# Patient Record
Sex: Female | Born: 1990
Health system: Southern US, Community
[De-identification: ages and names within clinical notes are randomized; demographics above are authoritative.]

## PROBLEM LIST (undated history)

## (undated) DIAGNOSIS — Z9289 Personal history of other medical treatment: Secondary | ICD-10-CM

## (undated) DIAGNOSIS — I351 Nonrheumatic aortic (valve) insufficiency: Secondary | ICD-10-CM

## (undated) DIAGNOSIS — G43909 Migraine, unspecified, not intractable, without status migrainosus: Secondary | ICD-10-CM

## (undated) DIAGNOSIS — R0602 Shortness of breath: Secondary | ICD-10-CM

## (undated) DIAGNOSIS — R002 Palpitations: Secondary | ICD-10-CM

## (undated) HISTORY — DX: Shortness of breath: R06.02

## (undated) HISTORY — DX: Personal history of other medical treatment: Z92.89

## (undated) HISTORY — DX: Migraine, unspecified, not intractable, without status migrainosus: G43.909

## (undated) HISTORY — DX: Palpitations: R00.2

## (undated) HISTORY — DX: Nonrheumatic aortic (valve) insufficiency: I35.1

## (undated) HISTORY — PX: NO PAST SURGERIES: SHX2092

---

## 2013-07-29 ENCOUNTER — Encounter (INDEPENDENT_AMBULATORY_CARE_PROVIDER_SITE_OTHER): Payer: Self-pay

## 2013-07-29 ENCOUNTER — Encounter: Payer: Self-pay | Admitting: Cardiology

## 2013-07-29 ENCOUNTER — Ambulatory Visit (INDEPENDENT_AMBULATORY_CARE_PROVIDER_SITE_OTHER): Payer: BC Managed Care – PPO | Admitting: Interventional Cardiology

## 2013-07-29 ENCOUNTER — Encounter: Payer: Self-pay | Admitting: Interventional Cardiology

## 2013-07-29 VITALS — BP 108/66 | HR 78 | Ht 65.0 in | Wt 121.0 lb

## 2013-07-29 DIAGNOSIS — R002 Palpitations: Secondary | ICD-10-CM

## 2013-07-29 NOTE — Patient Instructions (Signed)
Your physician recommends that you schedule a follow-up appointment as needed  

## 2013-07-29 NOTE — Progress Notes (Signed)
Patient ID: Misty [name], female   DOB: Jul 28, 1990, 23 y.o.   MRN: [id number]    [address] [place], [place]  [address] Phone: [phone] Fax:  [phone]  Date:  07/29/2013   ID:  Misty [name], DOB September 05, 1990, MRN [id number]  PCP:  [name], MD      History of Present Illness: Misty [name] is a 23 y.o. female who had mild aortic insufficiency diagnosed at age 66. She has not had an echo since age 77, until 2014.  She had normal LV function on an echo. She has had a fast heart rate at times or years. She did not pay much attention a that time. She was given adderall and her fast heart rate became more prolonged and frequent. No syncope. She stopped adderall and caffeine and the tachycardia persists. She avoids workouts now due to the fast HR. The fast HR can cause some anxiety. She was prescribed propranolol but did not take it. Occasional chest pain. She does not want to take propranolol. She is concerned that the dose is too high. She also does not want to take a daily medication. In the past, she used marijuana to help her relax. She describes herself as a hyper person. She has had anxiety issues in the past.  Her anxiety is better.  SHe is not taking any meds regularly.  She occasionally feels her heart pounding after drinking alcohol. She has not had any leg swelling, orthopnea, PND. She has occasional cramps in her chest when she takes deep breaths. No exertional chest discomfort. She has not been exercising very regularly.    Wt Readings from Last 3 Encounters:  07/29/13 121 lb (54.885 kg)     Past Medical History  Diagnosis Date  . SOB (shortness of breath)   . Fluttering heart   . Migraines     No current outpatient prescriptions on file.   No current facility-administered medications for this visit.    Allergies:   No Known Allergies  Social History:  The patient  reports that she has quit smoking. She does not have any smokeless tobacco  history on file. She reports that she does not drink alcohol.   Family History:  The patient's family history includes CVA in her paternal grandfather; Heart disease in her maternal grandmother; Hypertension in her father and maternal grandfather; Thyroid disease in her mother.   ROS:  Please see the history of present illness.  No nausea, vomiting.  No fevers, chills.  No focal weakness.  No dysuria.    All other systems reviewed and negative.   PHYSICAL EXAM: VS:  BP 108/66  Pulse 78  Ht 5\' 5"  (1.651 m)  Wt 121 lb (54.885 kg)  BMI 20.14 kg/m2 Well nourished, well developed, in no acute distress HEENT: normal Neck: no JVD, no carotid bruits Cardiac:  normal S1, S2; RRR;  Lungs:  clear to auscultation bilaterally, no wheezing, rhonchi or rales Abd: soft, nontender, no hepatomegaly Ext: no edema Skin: warm and dry Neuro:   no focal abnormalities noted  EKG:  Normal   ASSESSMENT AND PLAN:  1. Aortic insufficiency: Diagnosed as a child. It was trace on her last echocardiogram. No signs of heart failure. 2. Palpitations: Not as bothersome as they have been in the past. Occur after drinking alcohol. She recalls that when she had the heart monitor the last time, she did not wear it properly. Her symptoms are much better now. Would not pursue monitor  at this time.  No irregular heartbeats. She will let us know if this becomes a problem. 3. Increase regular activity to 5 days a week, 30 minutes a day of cardiovascular exercise.  Signed, Fredric MareJay S. Nellie Pester, MD, [facility] InstituteFACC 07/29/2013 3:07 PM

## 2014-07-07 ENCOUNTER — Emergency Department (HOSPITAL_BASED_OUTPATIENT_CLINIC_OR_DEPARTMENT_OTHER)
Admission: EM | Admit: 2014-07-07 | Discharge: 2014-07-07 | Disposition: A | Discharge status: Home / Self Care | Payer: Federal, State, Local not specified - PPO | Attending: Emergency Medicine | Admitting: Emergency Medicine

## 2014-07-07 ENCOUNTER — Encounter (HOSPITAL_BASED_OUTPATIENT_CLINIC_OR_DEPARTMENT_OTHER): Payer: Self-pay

## 2014-07-07 DIAGNOSIS — Z87891 Personal history of nicotine dependence: Secondary | ICD-10-CM | POA: Insufficient documentation

## 2014-07-07 DIAGNOSIS — Z8679 Personal history of other diseases of the circulatory system: Secondary | ICD-10-CM | POA: Insufficient documentation

## 2014-07-07 DIAGNOSIS — R002 Palpitations: Secondary | ICD-10-CM

## 2014-07-07 DIAGNOSIS — R42 Dizziness and giddiness: Secondary | ICD-10-CM | POA: Insufficient documentation

## 2014-07-07 LAB — CBC WITH DIFFERENTIAL/PLATELET
Basophils Absolute: 0 10*3/uL (ref 0.0–0.1)
Basophils Relative: 0 % (ref 0–1)
EOS ABS: 0.1 10*3/uL (ref 0.0–0.7)
EOS PCT: 1 % (ref 0–5)
HCT: 40.1 % (ref 36.0–46.0)
HEMOGLOBIN: 13.8 g/dL (ref 12.0–15.0)
LYMPHS ABS: 1.6 10*3/uL (ref 0.7–4.0)
Lymphocytes Relative: 23 % (ref 12–46)
MCH: 32.2 pg (ref 26.0–34.0)
MCHC: 34.4 g/dL (ref 30.0–36.0)
MCV: 93.5 fL (ref 78.0–100.0)
MONO ABS: 0.4 10*3/uL (ref 0.1–1.0)
MONOS PCT: 5 % (ref 3–12)
Neutro Abs: 4.9 10*3/uL (ref 1.7–7.7)
Neutrophils Relative %: 71 % (ref 43–77)
Platelets: 234 10*3/uL (ref 150–400)
RBC: 4.29 MIL/uL (ref 3.87–5.11)
RDW: 11.4 % — ABNORMAL LOW (ref 11.5–15.5)
WBC: 7 10*3/uL (ref 4.0–10.5)

## 2014-07-07 LAB — BASIC METABOLIC PANEL
Anion gap: 11 (ref 5–15)
BUN: 11 mg/dL (ref 6–23)
CO2: 26 mEq/L (ref 19–32)
CREATININE: 0.7 mg/dL (ref 0.50–1.10)
Calcium: 9.8 mg/dL (ref 8.4–10.5)
Chloride: 104 mEq/L (ref 96–112)
GFR calc Af Amer: >90 mL/min (ref >90)
GFR calc non Af Amer: >90 mL/min (ref >90)
GLUCOSE: 96 mg/dL (ref 70–99)
POTASSIUM: 4.1 meq/L (ref 3.7–5.3)
Sodium: 141 mEq/L (ref 137–147)

## 2014-07-07 LAB — TSH: TSH: 1.11 u[IU]/mL (ref 0.350–4.500)

## 2014-07-07 LAB — TROPONIN I: Troponin I: <0.3 ng/mL (ref <0.30)

## 2014-07-07 NOTE — ED Provider Notes (Signed)
CSN: [id number]     Arrival date & time 07/07/14  [id number] History   First MD Initiated Contact with Patient 07/07/14 1023     Chief Complaint  Patient presents with  . Palpitations     (Consider location/radiation/quality/duration/timing/severity/associated sxs/prior Treatment) HPI Comments: Patient is a 23 year old female with no significant past medical history. She presents with complaints of palpitations. She was sitting at her desk at work when she became dizzy and felt her heart began to race. This occurred several times in succession and was sent here for further evaluation.  The patient has a history of palpitations and has been evaluated by a cardiologist in the past. She was to have a Holter monitor exam, however this did not happen. She was also prescribed propranolol which she did not take. She is not seen cardiology in approximately a year and a half.  Patient is a 23 y.o. female presenting with palpitations. The history is provided by the patient.  Palpitations Palpitations quality:  Regular Onset quality:  Sudden Duration:  3 days Timing:  Intermittent Progression:  Resolved Chronicity:  New Context: anxiety   Context: not caffeine   Relieved by:  Nothing Worsened by:  Nothing tried Ineffective treatments:  None tried   Past Medical History  Diagnosis Date  . SOB (shortness of breath)   . Fluttering heart   . Migraines    History reviewed. No pertinent past surgical history. Family History  Problem Relation Age of Onset  . Thyroid disease Mother   . Hypertension Father   . Heart disease Maternal Grandmother   . Hypertension Maternal Grandfather   . CVA Paternal Grandfather    History  Substance Use Topics  . Smoking status: Former Games developermoker  . Smokeless tobacco: Not on file  . Alcohol Use: Yes     Comment: occasional   OB History    No data available     Review of Systems  Cardiovascular: Positive for palpitations.  All other systems reviewed and are  negative.     Allergies  Review of patient's allergies indicates no known allergies.  Home Medications   Prior to Admission medications   Not on File   BP 123/81 mmHg  Pulse 88  Temp(Src) 98.4 F (36.9 C) (Oral)  Resp 14  SpO2 100%  LMP 07/02/2014 Physical Exam  Constitutional: She is oriented to person, place, and time. She appears well-developed and well-nourished. No distress.  HENT:  Head: Normocephalic and atraumatic.  Neck: Normal range of motion. Neck supple.  Cardiovascular: Normal rate and regular rhythm.  Exam reveals no gallop and no friction rub.   No murmur heard. Pulmonary/Chest: Effort normal and breath sounds normal. No respiratory distress. She has no wheezes.  Abdominal: Soft. Bowel sounds are normal. She exhibits no distension. There is no tenderness.  Musculoskeletal: Normal range of motion.  Neurological: She is alert and oriented to person, place, and time.  Skin: Skin is warm and dry. She is not diaphoretic.  Nursing note and vitals reviewed.   ED Course  Procedures (including critical care time) Labs Review Labs Reviewed  BASIC METABOLIC PANEL  CBC WITH DIFFERENTIAL  TSH  TROPONIN I    Imaging Review No results found.   EKG Interpretation   Date/Time:  Thursday July 07 2014 09:40:40 EST Ventricular Rate:  78 PR Interval:  142 QRS Duration: 74 QT Interval:  344 QTC Calculation: 392 R Axis:   93 Text Interpretation:  Sinus rhythm with marked sinus arrhythmia Rightward  axis  Borderline ECG Confirmed by DELOS  MD, Genna Casimir ([id number]) on 07/07/2014  10:34:19 AM      MDM   Final diagnoses:  None    Patient presents with complaints of palpitations. She has a history of similar episodes which have been unexplained. She arrives here in a sinus rhythm with normal vital signs. Her EKG reveals a sinus arrhythmia, however is otherwise unremarkable. She has remained in a sinus rhythm throughout her emergency department course and  laboratory studies are unremarkable. TSH is pending. I feel as though she is appropriate for discharge. I have advised her to follow-up with her cardiologist to discuss a Holter monitor.    Geoffery Lyonsouglas Anicka Stuckert, MD 07/07/14 [phone]

## 2014-07-07 NOTE — Discharge Instructions (Signed)
Follow-up with your cardiologist to discuss Holter monitoring.  Return to the emergency department if your symptoms substantially worsen or change.   Holter Monitoring A Holter monitor is a small device with electrodes (small sticky patches) that attach to your chest. It records the electrical activity of your heart and is worn continuously for 24-48 hours.  A HOLTER MONITOR IS USED TO  Detect heart problems such as:  Heart arrhythmia. Is an abnormal or irregular heartbeat. With some heart arrhythmias, you may not feel or know that you have an irregular heart rhythm.  Palpitations, such as feeling your heart racing or fluttering. It is possible to have heart palpitations and not have a heart arrhythmia.  A heart rhythm that is too slow or too fast.  If you have problems fainting, near fainting or feeling light-headed, a Holter monitor may be worn to see if your heart is the cause. HOLTER MONITOR PREPARATION   Electrodes will be attached to the skin on your chest.  If you have hair on your chest, small areas may have to be shaved. This is done to help the patches stick better and make the recording more accurate.  The electrodes are attached by wires to the Holter monitor. The Holter monitor clips to your clothing. You will wear the monitor at all times, even while exercising and sleeping. HOME CARE INSTRUCTIONS   Wear your monitor at all times.  The wires and the monitor must stay dry. Do not get the monitor wet.  Do not bathe, swim or use a hot tub with it on.  You may do a "sponge" bath while you have the monitor on.  Keep your skin clean, do not put body lotion or moisturizer on your chest.  It's possible that your skin under the electrodes could become irritated. To keep this from happening, you may put the electrodes in slightly different places on your chest.  Your caregiver will also ask you to keep a diary of your activities, such as walking or doing chores. Be sure to  note what you are doing if you experience heart symptoms such as palpitations. This will help your caregiver determine what might be contributing to your symptoms. The information stored in your monitor will be reviewed by your caregiver alongside your diary entries.  Make sure the monitor is safely clipped to your clothing or in a location close to your body that your caregiver recommends.  The monitor and electrodes are removed when the test is over. Return the monitor as directed.  Be sure to follow up with your caregiver and discuss your Holter monitor results. SEEK IMMEDIATE MEDICAL CARE IF:  You faint or feel lightheaded.  You have trouble breathing.  You get pain in your chest, upper arm or jaw.  You feel sick to your stomach and your skin is pale, cool, or damp.  You think something is wrong with the way your heart is beating. MAKE SURE YOU:   Understand these instructions.  Will watch your condition.  Will get help right away if you are not doing well or get worse. Document Released: 04/05/2004 Document Revised: 09/30/2011 Document Reviewed: 08/18/2008 [facility] CenterExit[facility] Patient Information 2015 [place], [place]. This information is not intended to replace advice given to you by your health care provider. Make sure you discuss any questions you have with your health care provider.  Palpitations A palpitation is the feeling that your heartbeat is irregular or is faster than normal. It may feel like your heart is fluttering  or skipping a beat. Palpitations are usually not a serious problem. However, in some cases, you may need further medical evaluation. CAUSES  Palpitations can be caused by:  Smoking.  Caffeine or other stimulants, such as diet pills or energy drinks.  Alcohol.  Stress and anxiety.  Strenuous physical activity.  Fatigue.  Certain medicines.  Heart disease, especially if you have a history of irregular heart rhythms (arrhythmias), such as atrial  fibrillation, atrial flutter, or supraventricular tachycardia.  An improperly working pacemaker or defibrillator. DIAGNOSIS  To find the cause of your palpitations, your health care provider will take your medical history and perform a physical exam. Your health care provider may also have you take a test called an ambulatory electrocardiogram (ECG). An ECG records your heartbeat patterns over a 24-hour period. You may also have other tests, such as:  Transthoracic echocardiogram (TTE). During echocardiography, sound waves are used to evaluate how blood flows through your heart.  Transesophageal echocardiogram (TEE).  Cardiac monitoring. This allows your health care provider to monitor your heart rate and rhythm in real time.  Holter monitor. This is a portable device that records your heartbeat and can help diagnose heart arrhythmias. It allows your health care provider to track your heart activity for several days, if needed.  Stress tests by exercise or by giving medicine that makes the heart beat faster. TREATMENT  Treatment of palpitations depends on the cause of your symptoms and can vary greatly. Most cases of palpitations do not require any treatment other than time, relaxation, and monitoring your symptoms. Other causes, such as atrial fibrillation, atrial flutter, or supraventricular tachycardia, usually require further treatment. HOME CARE INSTRUCTIONS   Avoid:  Caffeinated coffee, tea, soft drinks, diet pills, and energy drinks.  Chocolate.  Alcohol.  Stop smoking if you smoke.  Reduce your stress and anxiety. Things that can help you relax include:  A method of controlling things in your body, such as your heartbeats, with your mind (biofeedback).  Yoga.  Meditation.  Physical activity such as swimming, jogging, or walking.  Get plenty of rest and sleep. SEEK MEDICAL CARE IF:   You continue to have a fast or irregular heartbeat beyond 24 hours.  Your  palpitations occur more often. SEEK IMMEDIATE MEDICAL CARE IF:  You have chest pain or shortness of breath.  You have a severe headache.  You feel dizzy or you faint. MAKE SURE YOU:  Understand these instructions.  Will watch your condition.  Will get help right away if you are not doing well or get worse. Document Released: 07/05/2000 Document Revised: 07/13/2013 Document Reviewed: 09/06/2011 [facility] Patient Information 2015 [place], [place]. This information is not intended to replace advice given to you by your health care provider. Make sure you discuss any questions you have with your health care provider.

## 2014-07-07 NOTE — ED Notes (Signed)
MD at bedside. 

## 2014-07-07 NOTE — ED Notes (Addendum)
Palpitations that started while sitting at desk at work associated with dizziness.  Has hx of previous symptoms, seen by cardiologist advised to wear a holter monitor, however did not.  States she has been ok until 6 days ago developed palpitations again. Denies CP.

## 2015-03-16 ENCOUNTER — Ambulatory Visit (INDEPENDENT_AMBULATORY_CARE_PROVIDER_SITE_OTHER): Payer: Federal, State, Local not specified - PPO | Admitting: Interventional Cardiology

## 2015-03-16 ENCOUNTER — Encounter: Payer: Self-pay | Admitting: Interventional Cardiology

## 2015-03-16 ENCOUNTER — Telehealth: Payer: Self-pay | Admitting: Interventional Cardiology

## 2015-03-16 VITALS — BP 88/68 | HR 76 | Ht 65.0 in | Wt 109.6 lb

## 2015-03-16 DIAGNOSIS — R0789 Other chest pain: Secondary | ICD-10-CM

## 2015-03-16 DIAGNOSIS — R002 Palpitations: Secondary | ICD-10-CM

## 2015-03-16 DIAGNOSIS — R200 Anesthesia of skin: Secondary | ICD-10-CM

## 2015-03-16 NOTE — Patient Instructions (Signed)
**Note De-identified Constantino Starace Obfuscation** Medication Instructions:  Same-no change  Labwork: None  Testing/Procedures: None  Follow-Up: Your physician recommends that you schedule a follow-up appointment in: as needed      

## 2015-03-16 NOTE — Telephone Encounter (Signed)
New message      Pt was at work this am and EMT was called.  She has a fast heartbeat and both arms/legs went numb.  She almost fainted.  Her bp was normal.

## 2015-03-16 NOTE — Telephone Encounter (Signed)
Spoke with pt and she states that she called EMS today due to her HR being increased. Pt states that she also experienced bilateral upper and lower extremity numbness, she was sitting when this occurred. Pt states BP was 118/74, HR 88 with EMS. Pt states that she also felt like she was going to pass out but didn't completely go out. Pt states that she does not take any medications and did eat breakfast this morning. Pt states that a few months ago she went to ED for chest tightness, palpitations and SOB and was released and told to f/u with cardiology but hasn't as of yet. Spoke with Dr. Eldridge Dace and he said to have pt come in today. Spoke with pt and scheduled her to come in at 2:30P today. Pt verbalized understanding and was in agreement with this plan.

## 2015-03-17 NOTE — Progress Notes (Signed)
Patient ID: Misty [name], female   DOB: [birth year], 24 y.o.   MRN: [id number]     Cardiology Office Note   Date:  03/17/2015   ID:  Misty, [name] 04-05-1991, MRN [id number]  PCP:  Misty Rua, MD    No chief complaint on file.  palpitations   Wt Readings from Last 3 Encounters:  03/16/15 109 lb 9.6 oz (49.714 kg)  07/29/13 121 lb (54.885 kg)       History of Present Illness: Misty [name] is a 24 y.o. female  who had mild aortic insufficiency diagnosed at age 23. She had not had an echo since age 61, until 2014. She had normal LV function on an echo and trace aortic insufficiency. She has had a fast heart rate at times for years with a negative workup in the past.  She does not want to take propranolol. She also does not want to take a daily medication. In the past, she used marijuana to help her relax. She describes herself as a hyper person. She has had anxiety issues in the past.  She was at work sitting in a meeting. She was quite relaxed. She was leaning back in her chair. She then felt numbness in her legs. She became somewhat anxious and then felt her heart racing. She tried to stand up and felt somewhat lightheaded. She did not pass out. EMS was called. She felt better by the time they arrived and declined going to the hospital. She is here for follow-up.  At this time, she denies any palpitations. The numbness has resolved. Overall, she feels well. She states that she had eaten a breakfast consisting of [place] toast and a muffin. She had also had plenty of water in the morning.  She reports a strange sensation in her left lower rib cage. It feels like a bubble. It is not painful but there is some pressure associated with it. It can be worse with deep breathing.  Other times, she can have a pressure in the upper part of the sternum. This is not associated with exercise. She is trying to increase her physical activity.  The patient's mother was also present for the  entire visit.    Past Medical History  Diagnosis Date  . SOB (shortness of breath)   . Fluttering heart   . Migraines     No past surgical history on file.   No current outpatient prescriptions on file.   No current facility-administered medications for this visit.    Allergies:   Review of patient's allergies indicates no known allergies.    Social History:  The patient  reports that she has quit smoking. She has never used smokeless tobacco. She reports that she drinks alcohol. She reports that she does not use illicit drugs.   Family History:  The patient's family history includes CVA in her paternal grandfather; Heart disease in her maternal grandmother; Hypertension in her father and maternal grandfather; Thyroid disease in her mother.    ROS:  Please see the history of present illness.   Otherwise, review of systems are positive for numbness and palpitations as noted above.   All other systems are reviewed and negative.    PHYSICAL EXAM: VS:  BP 88/68 mmHg  Pulse 76  Ht 5\' 5"  (1.651 m)  Wt 109 lb 9.6 oz (49.714 kg)  BMI 18.24 kg/m2 , BMI Body mass index is 18.24 kg/(m^2). GEN: Well nourished, well developed, in no acute distress HEENT: normal Neck: no JVD,  carotid bruits, or masses Cardiac: RRR; no murmurs, rubs, or gallops,no edema ; no pain to palpation of the left lower rib cage or upper sternum Respiratory:  clear to auscultation bilaterally, normal work of breathing GI: soft, nontender, nondistended, + BS MS: no deformity or atrophy Skin: warm and dry, no rash Neuro:  Strength and sensation are intact Psych: euthymic mood, full affect   EKG:   The ekg ordered today demonstrates normal sinus rhythm, RSR prime pattern in V1 no ST segment changes, normal QT interval   Recent Labs: 07/07/2014: BUN 11; Creatinine, Ser 0.70; Hemoglobin 13.8; Platelets 234; Potassium 4.1; Sodium 141; TSH 1.110   Lipid Panel No results found for: CHOL, TRIG, HDL, CHOLHDL,  VLDL, LDLCALC, LDLDIRECT   Other studies Reviewed: Additional studies/ records that were reviewed today with results demonstrating: Echo report reviewed.   ASSESSMENT AND PLAN:  1. Palpitations: She thinks this was from anxiety after she had the numbness. We discussed her wearing an outpatient monitor but she does not want to do this. She will let us know if symptoms get worse. 2. Chest pressure: I suspect her sensation is more likely due to reflux. She has no ischemic changes by ECG and the sensation is very atypical for ischemia. There is no pain to palpation of the left lower rib cage on exam today. She will try to keep track of whether there is any connection to what she eats and this symptom. 3. Numbness: I recommended that she try to move around at least every hour when she is at work. The numbness may be more frequent if she stays seated for long periods of time like she did today.   Current medicines are reviewed at length with the patient today.  The patient concerns regarding her medicines were addressed.  The following changes have been made:  No change  Labs/ tests ordered today include:  Orders Placed This Encounter  Procedures  . EKG 12-Lead    Recommend 150 minutes/week of aerobic exercise Low fat, low carb, high fiber diet recommended  Disposition:   FU in prn   Misty [name]., MD  03/17/2015 11:54 AM    [facility] [address] [place], [place], [place]  [address] Phone: [phone]; Fax: [phone]

## 2016-08-12 DIAGNOSIS — N926 Irregular menstruation, unspecified: Secondary | ICD-10-CM | POA: Diagnosis not present

## 2016-08-12 DIAGNOSIS — J029 Acute pharyngitis, unspecified: Secondary | ICD-10-CM | POA: Diagnosis not present

## 2016-08-27 DIAGNOSIS — J9801 Acute bronchospasm: Secondary | ICD-10-CM | POA: Diagnosis not present

## 2016-11-13 ENCOUNTER — Other Ambulatory Visit (HOSPITAL_COMMUNITY)
Admission: RE | Admit: 2016-11-13 | Discharge: 2016-11-13 | Disposition: A | Discharge status: Home / Self Care | Payer: Federal, State, Local not specified - PPO | Source: Ambulatory Visit | Attending: Obstetrics & Gynecology | Admitting: Obstetrics & Gynecology

## 2016-11-13 ENCOUNTER — Other Ambulatory Visit: Payer: Self-pay | Admitting: Obstetrics & Gynecology

## 2016-11-13 DIAGNOSIS — Z3201 Encounter for pregnancy test, result positive: Secondary | ICD-10-CM | POA: Diagnosis not present

## 2016-11-13 DIAGNOSIS — Z01419 Encounter for gynecological examination (general) (routine) without abnormal findings: Secondary | ICD-10-CM | POA: Insufficient documentation

## 2016-11-13 DIAGNOSIS — Z113 Encounter for screening for infections with a predominantly sexual mode of transmission: Secondary | ICD-10-CM | POA: Insufficient documentation

## 2016-11-13 DIAGNOSIS — N926 Irregular menstruation, unspecified: Secondary | ICD-10-CM | POA: Diagnosis not present

## 2016-11-14 LAB — CYTOLOGY - PAP
CHLAMYDIA, DNA PROBE: NEGATIVE
Diagnosis: NEGATIVE
Neisseria Gonorrhea: NEGATIVE

## 2017-01-27 DIAGNOSIS — N926 Irregular menstruation, unspecified: Secondary | ICD-10-CM | POA: Diagnosis not present

## 2017-01-27 DIAGNOSIS — J069 Acute upper respiratory infection, unspecified: Secondary | ICD-10-CM | POA: Diagnosis not present

## 2017-03-01 DIAGNOSIS — F419 Anxiety disorder, unspecified: Secondary | ICD-10-CM | POA: Diagnosis not present

## 2017-03-01 DIAGNOSIS — R0789 Other chest pain: Secondary | ICD-10-CM | POA: Diagnosis not present

## 2017-03-03 ENCOUNTER — Telehealth: Payer: Self-pay | Admitting: Interventional Cardiology

## 2017-03-03 DIAGNOSIS — E0789 Other specified disorders of thyroid: Secondary | ICD-10-CM | POA: Diagnosis not present

## 2017-03-03 DIAGNOSIS — R202 Paresthesia of skin: Secondary | ICD-10-CM | POA: Diagnosis not present

## 2017-03-03 DIAGNOSIS — R200 Anesthesia of skin: Secondary | ICD-10-CM | POA: Diagnosis not present

## 2017-03-03 NOTE — Telephone Encounter (Signed)
New message       Pt c/o of Chest Pain: STAT if CP now or developed within 24 hours  1. Are you having CP right now?  yes 2. Are you experiencing any other symptoms (ex. SOB, nausea, vomiting, sweating)?  Jaw pain and neck pain 3. How long have you been experiencing CP?  1 week 4. Is your CP continuous or coming and going?  Comes and goes but over the weekend, pain got worse 5. Have you taken Nitroglycerin?  ?no Pt went urgent care on Saturday.  She was advised to see the doctor today.  Pt has her EKG

## 2017-03-03 NOTE — Telephone Encounter (Signed)
Called patient back. Patient had [place]. Requested patient to have records faxed to our office. Patient verbalized understanding.

## 2017-03-03 NOTE — Telephone Encounter (Signed)
Patient complaining of chest pain for one week that comes and goes, but has been consistent for the past few days. Patient stated at times she get SOB. Patient stated at the time of her chest pain she was just sitting around and she was not very active. Patient stated she went to urgent care and her EKG was abnormal and she was told to see her cardiologist. Made an appointment with Tereso NewcomerScott Weaver PA on 03/05/17, the first available. Encouraged patient to go to ED if her symptoms become worse before her appointment. Patient verbalized understanding.

## 2017-03-03 NOTE — Telephone Encounter (Signed)
New message    Pt returning Pam call about chest pain

## 2017-03-05 ENCOUNTER — Ambulatory Visit (INDEPENDENT_AMBULATORY_CARE_PROVIDER_SITE_OTHER): Payer: Federal, State, Local not specified - PPO | Admitting: Physician Assistant

## 2017-03-05 ENCOUNTER — Encounter: Payer: Self-pay | Admitting: Physician Assistant

## 2017-03-05 VITALS — BP 110/60 | HR 80 | Ht 65.0 in | Wt 113.0 lb

## 2017-03-05 DIAGNOSIS — R002 Palpitations: Secondary | ICD-10-CM

## 2017-03-05 DIAGNOSIS — R9431 Abnormal electrocardiogram [ECG] [EKG]: Secondary | ICD-10-CM | POA: Diagnosis not present

## 2017-03-05 MED ORDER — PROPRANOLOL HCL 10 MG PO TABS: 10.0000 mg | ORAL_TABLET | Freq: Every day | ORAL | 1 refills | Status: DC | PRN

## 2017-03-05 NOTE — Patient Instructions (Addendum)
Medication Instructions:  1. START PROPRANOLOL 10 MG TABLET; DIRECTIONS: YOU MAY TAKE 1 TABLET AS NEEDED FOR PALPITATIONS  Labwork: NONE ORDERED  Testing/Procedures: NONE ORDERED  Follow-Up: 06/05/17 @ 4 PM WITH DR. VARANASI   Any Other Special Instructions Will Be Listed Below (If Applicable). PER SCOTT WEAVER, PAC THAT YOU WILL OBTAIN RECORDS FROM THE CARDIOLOGIST IN [place], ASK IF YOU HAD AN ECHOCARDIOGRAM, HOLTER MONITOR. PLEASE HAVE THESE RECORDS FAXED TO [facility], [place] [phone]; YOU MAY NEED TO SIGN A RELEASE WITH MEDICAL RECORDS, LET US KNOW IF THIS REQUIRED.     If you need a refill on your cardiac medications before your next appointment, please call your pharmacy.

## 2017-03-05 NOTE — Progress Notes (Signed)
Cardiology Office Note:    Date:  03/05/2017   ID:  Misty [name], DOB 14-Aug-1990, MRN [id number]  PCP:  Joycelyn RuaMeyers, Stephen, MD  Cardiologist:  Dr. Everette RankJay Varanasi    Referring MD: Joycelyn RuaMeyers, Stephen, MD   Chief Complaint  Patient presents with  . Palpitations    History of Present Illness:    Misty [name] is a 26 y.o. female with a hx of mild aortic insufficiency dx at age 89, tachycardia with a neg workup in the past.  GXT in 2014 was normal.  Echo in 2014 demonstrated normal EF and only trace AI.  Last seen by Dr. Everette RankJay Varanasi in 8/16 with plans to follow up as needed.    Misty [name] returns for follow-up on palpitations. She has had chronic palpitations for years with associated atypical chest discomfort. She notes her heart rate increases significantly with activity. She is fairly sedentary. She works in [employer]. [place]. While there, she did see a cardiologist for worsening palpitations. She apparently had a Holter monitor and an echocardiogram in Dec 2017. She feels that the results were normal. She denies exertional chest discomfort or shortness of breath. She denies PND or edema. She denies syncope. She avoids caffeine.  Prior CV studies:   The following studies were reviewed today:  GXT 5/14 No ischemic changes  Echo 5/14 EF 60-65, trace MR/AI,   Past Medical History:  Diagnosis Date  . Fluttering heart   . Migraines   . SOB (shortness of breath)     History reviewed. No pertinent surgical history.  Current Medications: No outpatient prescriptions have been marked as taking for the 03/05/17 encounter (Office Visit) with Tereso NewcomerWeaver, Tashana Haberl T, PA-C.     Allergies:   Patient has no known allergies.   Social History   Social History  . Marital status: Single    Spouse name: N/A  . Number of children: N/A  . Years of education: N/A   Social History Main Topics  . Smoking status: Former Games developermoker  . Smokeless tobacco:  Never Used  . Alcohol use 0.0 oz/week     Comment: occasional  . Drug use: No  . Sexual activity: Not Asked   Other Topics Concern  . None   Social History Narrative  . None     Family Hx: The patient's family history includes CVA in her paternal grandfather; Heart disease in her maternal grandmother; Hypertension in her father and maternal grandfather; Thyroid disease in her mother.  ROS:   Please see the history of present illness.    Review of Systems  Cardiovascular: Positive for chest pain and irregular heartbeat.  Neurological: Positive for headaches.  Psychiatric/Behavioral: The patient is nervous/anxious.    All other systems reviewed and are negative.   EKGs/Labs/Other Test Reviewed:    EKG:  EKG is  ordered today.  The ekg ordered today demonstrates NSR, HR 81, normal axis, RSR' V1, QTc 406 ms, no change since prior tracing (03/16/15)  Recent Labs: No results found for requested labs within last 8760 hours.   Recent Lipid Panel No results found for: CHOL, TRIG, HDL, CHOLHDL, LDLCALC, LDLDIRECT  Physical Exam:    VS:  BP 110/60   Pulse 80   Ht 5\' 5"  (1.651 m)   Wt 113 lb (51.3 kg)   BMI 18.80 kg/m     Wt Readings from Last 3 Encounters:  03/05/17 113 lb (51.3 kg)  03/16/15 109 lb 9.6 oz (  49.7 kg)  07/29/13 121 lb (54.9 kg)     Physical Exam  Constitutional: She is oriented to person, place, and time. She appears well-developed and well-nourished. No distress.  HENT:  Head: Normocephalic and atraumatic.  Eyes: No scleral icterus.  Neck: Normal range of motion. No JVD present.  Cardiovascular: Normal rate, regular rhythm, S1 normal, S2 normal and normal heart sounds.   No murmur heard. Pulmonary/Chest: Breath sounds normal. She has no wheezes. She has no rhonchi. She has no rales.  Abdominal: Soft. There is no tenderness.  Musculoskeletal: She exhibits no edema.  Neurological: She is alert and oriented to person, place, and time.  Skin: Skin is warm  and dry.  Psychiatric: She has a normal mood and affect.    ASSESSMENT:    1. Palpitations   2. Abnormal EKG    PLAN:    In order of problems listed above:  1. Palpitations -  She has a long history of palpitations. She had an echocardiogram and Holter monitor in [place] in December of last year. These were apparently normal. She has declined when necessary beta blockers in the past. She would now like to have low-dose propranolol on hand to use for palpitations in the future.  -  Request records from [place]  -  Propranolol 10 mg daily when necessary palpitations  2. Abnormal EKG - There was some concern by a doctor at an urgent care in [facility] about her EKG. She has a RSR prime in lead V1. This is unchanged compared to prior ECG here in 2015 and 2016. As noted, she had a recent echocardiogram and Holter. We will obtain those results.   Dispo:  Return in about 3 months (around 06/05/2017) for Routine Follow Up w/ Dr. Eldridge Dace .   Medication Adjustments/Labs and Tests Ordered: Current medicines are reviewed at length with the patient today.  Concerns regarding medicines are outlined above.  Tests Ordered: Orders Placed This Encounter  Procedures  . EKG 12-Lead   Medication Changes: Meds ordered this encounter  Medications  . propranolol (INDERAL) 10 MG tablet    Sig: Take 1 tablet (10 mg total) by mouth daily as needed.    Dispense:  20 tablet    Refill:  1    Signed, Tereso Newcomer, PA-C  03/05/2017 5:28 PM    [facility] [address] [place], [place], [place]  [address] Phone: [phone]; Fax: [phone]

## 2017-03-12 ENCOUNTER — Telehealth: Payer: Self-pay | Admitting: Physician Assistant

## 2017-03-12 NOTE — Telephone Encounter (Signed)
Release Of information faxed to Dr.Nikitina @ [phone].

## 2017-03-14 ENCOUNTER — Telehealth: Payer: Self-pay | Admitting: Physician Assistant

## 2017-03-14 NOTE — Telephone Encounter (Signed)
Records received Via Fax from Dr.Nikitina office. Placed in Chart Prep.

## 2017-04-22 DIAGNOSIS — Z113 Encounter for screening for infections with a predominantly sexual mode of transmission: Secondary | ICD-10-CM | POA: Diagnosis not present

## 2017-04-22 DIAGNOSIS — Z114 Encounter for screening for human immunodeficiency virus [HIV]: Secondary | ICD-10-CM | POA: Diagnosis not present

## 2017-05-04 DIAGNOSIS — R350 Frequency of micturition: Secondary | ICD-10-CM | POA: Diagnosis not present

## 2017-05-04 DIAGNOSIS — R300 Dysuria: Secondary | ICD-10-CM | POA: Diagnosis not present

## 2017-05-23 ENCOUNTER — Encounter: Payer: Self-pay | Admitting: Interventional Cardiology

## 2017-05-26 DIAGNOSIS — O26899 Other specified pregnancy related conditions, unspecified trimester: Secondary | ICD-10-CM | POA: Diagnosis not present

## 2017-05-26 DIAGNOSIS — O209 Hemorrhage in early pregnancy, unspecified: Secondary | ICD-10-CM | POA: Diagnosis not present

## 2017-06-04 DIAGNOSIS — Z3481 Encounter for supervision of other normal pregnancy, first trimester: Secondary | ICD-10-CM | POA: Diagnosis not present

## 2017-06-04 DIAGNOSIS — O26899 Other specified pregnancy related conditions, unspecified trimester: Secondary | ICD-10-CM | POA: Diagnosis not present

## 2017-06-04 DIAGNOSIS — Z3A01 Less than 8 weeks gestation of pregnancy: Secondary | ICD-10-CM | POA: Diagnosis not present

## 2017-06-04 DIAGNOSIS — Z3400 Encounter for supervision of normal first pregnancy, unspecified trimester: Secondary | ICD-10-CM | POA: Diagnosis not present

## 2017-06-04 NOTE — Progress Notes (Signed)
Cardiology Office Note   Date:  06/05/2017   ID:  Misty [name], DOB [birth year], MRN [id number]  PCP:  No primary care provider on file.    No chief complaint on file. palpitations   Wt Readings from Last 3 Encounters:  06/05/17 117 lb 9.6 oz (53.3 kg)  03/05/17 113 lb (51.3 kg)  03/16/15 109 lb 9.6 oz (49.7 kg)       History of Present Illness: Misty BarKrista [name] is a 26 y.o. female   who had mild aortic insufficiency diagnosed at age 26. She had not had an echo since age 26, until 2014. She had normal LV function on an echo and trace aortic insufficiency. She has had a fast heart rate at times for years with a negative workup in the past.  She does not want to take propranolol. She also does not want to take a daily medication. In the past, she used marijuana to help her relax. She describes herself as a hyper person. She has had anxiety issues in the past.  She has had palpitations in December 2017, while living in.  She had a full workup including a monitor, echocardiogram and blood work.  Echocardiogram and monitor were unremarkable.  She is now [redacted] weeks pregnant.  She is not having any palpitations at this time.  She is aware that there will be changes to her blood volume and possibly episodic palpitations.  She does feel when she breathes that her heart rate speeds up.  Overall, she is tolerating pregnancy well.  She is not having any significant nausea or vomiting.    Past Medical History:  Diagnosis Date  . Fluttering heart   . Migraines   . SOB (shortness of breath)     History reviewed. No pertinent surgical history.   Current Outpatient Medications  Medication Sig Dispense Refill  . propranolol (INDERAL) 10 MG tablet Take 1 tablet (10 mg total) by mouth daily as needed. 20 tablet 1   No current facility-administered medications for this visit.     Allergies:   Patient has no known allergies.    Social History:  The patient  reports that she has quit  smoking. she has never used smokeless tobacco. She reports that she drinks alcohol. She reports that she does not use drugs.   Family History:  The patient's family history includes CVA in her paternal grandfather; Heart disease in her maternal grandmother; Hypertension in her father and maternal grandfather; Thyroid disease in her mother.    ROS:  Please see the history of present illness.   Otherwise, review of systems are positive for rare palpitations.   All other systems are reviewed and negative.    PHYSICAL EXAM: VS:  BP 104/68   Pulse 77   Ht 5\' 5"  (1.651 m)   Wt 117 lb 9.6 oz (53.3 kg)   SpO2 99%   BMI 19.57 kg/m  , BMI Body mass index is 19.57 kg/m. GEN: Well nourished, well developed, in no acute distress  HEENT: normal  Neck: no JVD, carotid bruits, or masses Cardiac: RRR; no murmurs, rubs, or gallops,no edema  Respiratory:  clear to auscultation bilaterally, normal work of breathing GI: soft, nontender, nondistended, + BS MS: no deformity or atrophy  Skin: warm and dry, no rash Neuro:  Strength and sensation are intact Psych: euthymic mood, full affect   EKG:   The ekg ordered today demonstrates    Recent Labs: No results found for requested labs within last  8760 hours.   Lipid Panel No results found for: CHOL, TRIG, HDL, CHOLHDL, VLDL, LDLCALC, LDLDIRECT   Other studies Reviewed: Additional studies/ records that were reviewed today with results demonstrating: Echo, Holter and labs reviewed from December 2017.   ASSESSMENT AND PLAN:  1. Palpitations: Symptoms controlled.  Continue routine prenatal care.  I anticipate she may have some premature beats and this may be more frequent while pregnant.  I tried to reassure her that this is okay.  She will contact our office if she has any further issues. 2. Chest pain: She has had atypical chest pains in the past.  This still occurs when she has the cough take a deep breath.  No ischemic type pain noted.  She will  continue to monitor.   Current medicines are reviewed at length with the patient today.  The patient concerns regarding her medicines were addressed.  The following changes have been made:  No change  Labs/ tests ordered today include:  No orders of the defined types were placed in this encounter.   Recommend 150 minutes/week of aerobic exercise Low fat, low carb, high fiber diet recommended  Disposition:   FU as needed   Signed, Lance MussJayadeep Abbrielle Batts, MD  06/05/2017 4:27 PM    [facility] [address], [place], [place]  [address] Phone: [phone]; Fax: [phone]

## 2017-06-05 ENCOUNTER — Encounter: Payer: Self-pay | Admitting: Interventional Cardiology

## 2017-06-05 ENCOUNTER — Ambulatory Visit: Payer: Federal, State, Local not specified - PPO | Admitting: Interventional Cardiology

## 2017-06-05 VITALS — BP 104/68 | HR 77 | Ht 65.0 in | Wt 117.6 lb

## 2017-06-05 DIAGNOSIS — R002 Palpitations: Secondary | ICD-10-CM | POA: Diagnosis not present

## 2017-06-05 DIAGNOSIS — R071 Chest pain on breathing: Secondary | ICD-10-CM | POA: Diagnosis not present

## 2017-06-05 NOTE — Patient Instructions (Signed)
Medication Instructions:  Your physician recommends that you continue on your current medications as directed. Please refer to the Current Medication list given to you today.   Labwork: None ordered  Testing/Procedures: None ordered  Follow-Up: Your physician wants you to follow-up AS NEEDED  Any Other Special Instructions Will Be Listed Below (If Applicable).     If you need a refill on your cardiac medications before your next appointment, please call your pharmacy.   

## 2017-06-26 DIAGNOSIS — R300 Dysuria: Secondary | ICD-10-CM | POA: Diagnosis not present

## 2017-07-03 DIAGNOSIS — Z3482 Encounter for supervision of other normal pregnancy, second trimester: Secondary | ICD-10-CM | POA: Diagnosis not present

## 2017-07-03 DIAGNOSIS — Z3401 Encounter for supervision of normal first pregnancy, first trimester: Secondary | ICD-10-CM | POA: Diagnosis not present

## 2017-07-22 NOTE — L&D Delivery Note (Addendum)
Delivery Note  First Stage: Pt. Has been contracting since last Monday, with minimal cervical change.  She was admitted for observation yesterday for uterine contractions and discharge home from [facility] this evening without cervical change.   Labor onset: 12/02/17 @ 1930pm Augmentation : None Analgesia /Anesthesia intrapartum: none SROM at 1930pm - heavy meconium; pt. Called EMS and arrived to MAU complete/+4 station   Second Stage: Complete dilation at 2040 Onset of pushing at 2045 FHR second stage: Category 2, baseline 135 bpm/ moderate variability/ +accels/ decels to nadir of 90 bpm with pushing and good return to baseline  Delivery of a viable, vigorous preterm female at 41pm by Carlean Jews, CNM in ROA position in MAU.  NICU team present for delivery. Loose nuchal cord x1 - reduced over head prior to delivery of body Cord double clamped after cessation of pulsation, cut by CNM Cord blood sample collected   Third Stage: Placenta delivered via Tomasa Blase intact, small, meconium stained with calcifications with 3 VC @ 2102pm Placenta disposition: pathology  Uterine tone initially boggy with brisk bleeding; firmed with massage, IV Pitocin bolus infusing and Cytotec rectally given   Vaginal and bilateral labial minora lacerations identified  Anesthesia for repair: 1% Lidocaine  Repair: 3.0 vicryl for vaginal laceration with good hemostasis; labial lacerations small and hemostatic - not repaired  Est. Blood Loss (mL):  Complications: precipitous delivery   Mom to postpartum.  Baby to NICU.  Newborn: Birth Weight: 4#4.8oz (1950g)  Apgar Scores: 8, 8  Feeding planned: breast  Carlean Jews, MSN, CNM [facility]

## 2017-07-23 ENCOUNTER — Encounter (HOSPITAL_BASED_OUTPATIENT_CLINIC_OR_DEPARTMENT_OTHER): Payer: Self-pay | Admitting: Emergency Medicine

## 2017-07-23 ENCOUNTER — Other Ambulatory Visit: Payer: Self-pay

## 2017-07-23 ENCOUNTER — Emergency Department (HOSPITAL_BASED_OUTPATIENT_CLINIC_OR_DEPARTMENT_OTHER)
Admission: EM | Admit: 2017-07-23 | Discharge: 2017-07-23 | Disposition: A | Discharge status: Home / Self Care | Payer: Federal, State, Local not specified - PPO | Attending: Emergency Medicine | Admitting: Emergency Medicine

## 2017-07-23 DIAGNOSIS — O210 Mild hyperemesis gravidarum: Secondary | ICD-10-CM | POA: Insufficient documentation

## 2017-07-23 DIAGNOSIS — O26891 Other specified pregnancy related conditions, first trimester: Secondary | ICD-10-CM | POA: Insufficient documentation

## 2017-07-23 DIAGNOSIS — R102 Pelvic and perineal pain: Secondary | ICD-10-CM | POA: Diagnosis not present

## 2017-07-23 DIAGNOSIS — Z3A12 12 weeks gestation of pregnancy: Secondary | ICD-10-CM | POA: Insufficient documentation

## 2017-07-23 DIAGNOSIS — N949 Unspecified condition associated with female genital organs and menstrual cycle: Secondary | ICD-10-CM

## 2017-07-23 DIAGNOSIS — O219 Vomiting of pregnancy, unspecified: Secondary | ICD-10-CM

## 2017-07-23 DIAGNOSIS — Z87891 Personal history of nicotine dependence: Secondary | ICD-10-CM | POA: Diagnosis not present

## 2017-07-23 DIAGNOSIS — R110 Nausea: Secondary | ICD-10-CM | POA: Diagnosis not present

## 2017-07-23 DIAGNOSIS — R109 Unspecified abdominal pain: Secondary | ICD-10-CM | POA: Diagnosis not present

## 2017-07-23 LAB — URINALYSIS, ROUTINE W REFLEX MICROSCOPIC
Bilirubin Urine: NEGATIVE
Glucose, UA: NEGATIVE mg/dL
Hgb urine dipstick: NEGATIVE
Ketones, ur: NEGATIVE mg/dL
Leukocytes, UA: NEGATIVE
Nitrite: NEGATIVE
Protein, ur: NEGATIVE mg/dL
Specific Gravity, Urine: <1.005 — ABNORMAL LOW (ref 1.005–1.030)
pH: 6.5 (ref 5.0–8.0)

## 2017-07-23 LAB — PREGNANCY, URINE: Preg Test, Ur: POSITIVE — AB

## 2017-07-23 MED ORDER — ONDANSETRON HCL 4 MG PO TABS: 4.0000 mg | ORAL_TABLET | Freq: Three times a day (TID) | ORAL | 0 refills | Status: DC | PRN

## 2017-07-23 NOTE — ED Provider Notes (Signed)
[facility] EMERGENCY DEPARTMENT Provider Note   CSN: [id number] Arrival date & time: 07/23/17  1258     History   Chief Complaint Chief Complaint  Patient presents with  . Hyperemesis Gravidarum  . Abdominal Pain    HPI Misty [name] is a 27 y.o. female.  HPI   27 year old female with several complaints.  She is in the 14th week of her pregnancy.  She reports prior prenatal care with ultrasound showed an IUP.  The past several weeks she has had food aversion and intermittent nausea.  No vomiting.  She states that she is actually lost a couple pounds since the start of her pregnancy.  She has been drinking fluids well.  Last several days she has had intermittent sharp pain in the lower abdomen just right of the midline.  The pain is usually pretty brief seems to come and go randomly, but sometimes possibly more with certain movements.  She has no urinary complaints.  No vaginal bleeding.  This is her first pregnancy.  Past Medical History:  Diagnosis Date  . Fluttering heart   . Migraines   . SOB (shortness of breath)     Patient Active Problem List   Diagnosis Date Noted  . Palpitations 07/29/2013    History reviewed. No pertinent surgical history.  OB History    Gravida Para Term Preterm AB Living   1             SAB TAB Ectopic Multiple Live Births                   Home Medications    Prior to Admission medications   Medication Sig Start Date End Date Taking? Authorizing Provider  ondansetron (ZOFRAN) 4 MG tablet Take 1 tablet (4 mg total) by mouth every 8 (eight) hours as needed for nausea or vomiting. 07/23/17   Raeford Razor, MD  propranolol (INDERAL) 10 MG tablet Take 1 tablet (10 mg total) by mouth daily as needed. 03/05/17   Beatrice Lecher, PA-C    Family History Family History  Problem Relation Age of Onset  . Thyroid disease Mother   . Hypertension Father   . Heart disease Maternal Grandmother   . Hypertension Maternal Grandfather   .  CVA Paternal Grandfather     Social History Social History   Tobacco Use  . Smoking status: Former Games developer  . Smokeless tobacco: Never Used  Substance Use Topics  . Alcohol use: Yes    Alcohol/week: 0.0 oz    Comment: occasional  . Drug use: No     Allergies   Patient has no known allergies.   Review of Systems Review of Systems  All systems reviewed and negative, other than as noted in HPI.  Physical Exam Updated Vital Signs BP 121/76   Pulse 80   Temp 97.7 F (36.5 C) (Oral)   Resp 18   Ht 5\' 5"  (1.651 m)   Wt 49.9 kg (110 lb)   LMP 04/13/2017 (Exact Date)   SpO2 100%   BMI 18.30 kg/m   Physical Exam  Constitutional: She appears well-developed and well-nourished. No distress.  HENT:  Head: Normocephalic and atraumatic.  Eyes: Conjunctivae are normal. Right eye exhibits no discharge. Left eye exhibits no discharge.  Neck: Neck supple.  Cardiovascular: Normal rate, regular rhythm and normal heart sounds. Exam reveals no gallop and no friction rub.  No murmur heard. Pulmonary/Chest: Effort normal and breath sounds normal. No respiratory distress.  Abdominal:  Soft. She exhibits no distension. There is no tenderness.  Musculoskeletal: She exhibits no edema or tenderness.  Neurological: She is alert.  Skin: Skin is warm and dry.  Psychiatric: She has a normal mood and affect. Her behavior is normal. Thought content normal.  Nursing note and vitals reviewed.    ED Treatments / Results  Labs (all labs ordered are listed, but only abnormal results are displayed) Labs Reviewed  URINALYSIS, ROUTINE W REFLEX MICROSCOPIC - Abnormal; Notable for the following components:      Result Value   Specific Gravity, Urine <1.005 (*)    All other components within normal limits  PREGNANCY, URINE - Abnormal; Notable for the following components:   Preg Test, Ur POSITIVE (*)    All other components within normal limits    EKG  EKG Interpretation None        Radiology No results found.  Procedures Procedures (including critical care time)  Medications Ordered in ED Medications - No data to display   Initial Impression / Assessment and Plan / ED Course  I have reviewed the triage vital signs and the nursing notes.  Pertinent labs & imaging results that were available during my care of the patient were reviewed by me and considered in my medical decision making (see chart for details).     27 year old female with nausea.  She is in her 14th week of her pregnancy.  Bedside ultrasound was reassuring.  Fetal movement with heart rate approximately 140.  She is having sharp intermittent lower abdominal pain.  I suspect that this might be round ligament pain.  Her abdominal exam is benign.  Denies any bleeding.  No ketones or signs of infection on urinalysis.  I doubt significant complication of her pregnancy or other emergent condition.  Advised to follow-up with her OB to discuss further. Final Clinical Impressions(s) / ED Diagnoses   Final diagnoses:  Vomiting or nausea of pregnancy  Round ligament pain    ED Discharge Orders        Ordered    ondansetron (ZOFRAN) 4 MG tablet  Every 8 hours PRN     07/23/17 1518       Raeford RazorKohut, Aadhira Heffernan, MD 07/23/17 1646

## 2017-07-23 NOTE — ED Triage Notes (Signed)
Reports [redacted] weeks pregnant and unable to eat.  Reports no appetite and after eating she begins having abdominal pain.  Denies vomiting.

## 2017-08-01 DIAGNOSIS — Z3482 Encounter for supervision of other normal pregnancy, second trimester: Secondary | ICD-10-CM | POA: Diagnosis not present

## 2017-08-27 DIAGNOSIS — Z3482 Encounter for supervision of other normal pregnancy, second trimester: Secondary | ICD-10-CM | POA: Diagnosis not present

## 2017-08-27 DIAGNOSIS — Z36 Encounter for antenatal screening for chromosomal anomalies: Secondary | ICD-10-CM | POA: Diagnosis not present

## 2017-09-22 DIAGNOSIS — Z3482 Encounter for supervision of other normal pregnancy, second trimester: Secondary | ICD-10-CM | POA: Diagnosis not present

## 2017-10-16 ENCOUNTER — Other Ambulatory Visit: Payer: Self-pay | Admitting: Obstetrics & Gynecology

## 2017-10-16 DIAGNOSIS — Z3482 Encounter for supervision of other normal pregnancy, second trimester: Secondary | ICD-10-CM | POA: Diagnosis not present

## 2017-10-16 DIAGNOSIS — M79605 Pain in left leg: Secondary | ICD-10-CM

## 2017-10-17 ENCOUNTER — Ambulatory Visit
Admission: RE | Admit: 2017-10-17 | Discharge: 2017-10-17 | Disposition: A | Discharge status: Home / Self Care | Payer: Federal, State, Local not specified - PPO | Source: Ambulatory Visit | Attending: Obstetrics & Gynecology | Admitting: Obstetrics & Gynecology

## 2017-10-17 DIAGNOSIS — M79605 Pain in left leg: Secondary | ICD-10-CM

## 2017-10-24 DIAGNOSIS — Z3480 Encounter for supervision of other normal pregnancy, unspecified trimester: Secondary | ICD-10-CM | POA: Diagnosis not present

## 2017-10-24 DIAGNOSIS — Z3402 Encounter for supervision of normal first pregnancy, second trimester: Secondary | ICD-10-CM | POA: Diagnosis not present

## 2017-10-24 DIAGNOSIS — Z3403 Encounter for supervision of normal first pregnancy, third trimester: Secondary | ICD-10-CM | POA: Diagnosis not present

## 2017-11-04 DIAGNOSIS — Z3403 Encounter for supervision of normal first pregnancy, third trimester: Secondary | ICD-10-CM | POA: Diagnosis not present

## 2017-11-21 DIAGNOSIS — Z3403 Encounter for supervision of normal first pregnancy, third trimester: Secondary | ICD-10-CM | POA: Diagnosis not present

## 2017-11-24 ENCOUNTER — Inpatient Hospital Stay (HOSPITAL_COMMUNITY): Payer: Federal, State, Local not specified - PPO

## 2017-11-24 ENCOUNTER — Inpatient Hospital Stay (HOSPITAL_COMMUNITY)
Admission: AD | Admit: 2017-11-24 | Discharge: 2017-11-26 | DRG: 832 | Disposition: A | Discharge status: Home / Self Care | Payer: Federal, State, Local not specified - PPO | Source: Ambulatory Visit | Attending: Obstetrics and Gynecology | Admitting: Obstetrics and Gynecology

## 2017-11-24 ENCOUNTER — Encounter (HOSPITAL_COMMUNITY): Payer: Self-pay | Admitting: *Deleted

## 2017-11-24 DIAGNOSIS — Z87891 Personal history of nicotine dependence: Secondary | ICD-10-CM | POA: Diagnosis not present

## 2017-11-24 DIAGNOSIS — O288 Other abnormal findings on antenatal screening of mother: Secondary | ICD-10-CM

## 2017-11-24 DIAGNOSIS — B373 Candidiasis of vulva and vagina: Secondary | ICD-10-CM | POA: Diagnosis present

## 2017-11-24 DIAGNOSIS — O368130 Decreased fetal movements, third trimester, not applicable or unspecified: Secondary | ICD-10-CM | POA: Diagnosis not present

## 2017-11-24 DIAGNOSIS — O26893 Other specified pregnancy related conditions, third trimester: Secondary | ICD-10-CM | POA: Diagnosis not present

## 2017-11-24 DIAGNOSIS — Z3A32 32 weeks gestation of pregnancy: Secondary | ICD-10-CM

## 2017-11-24 DIAGNOSIS — O98813 Other maternal infectious and parasitic diseases complicating pregnancy, third trimester: Secondary | ICD-10-CM | POA: Diagnosis present

## 2017-11-24 DIAGNOSIS — O42013 Preterm premature rupture of membranes, onset of labor within 24 hours of rupture, third trimester: Secondary | ICD-10-CM | POA: Diagnosis not present

## 2017-11-24 LAB — DIFFERENTIAL
BASOS PCT: 0 %
Basophils Absolute: 0 10*3/uL (ref 0.0–0.1)
EOS ABS: 0.1 10*3/uL (ref 0.0–0.7)
EOS PCT: 0 %
Lymphocytes Relative: 18 %
Lymphs Abs: 2.9 10*3/uL (ref 0.7–4.0)
MONO ABS: 0.6 10*3/uL (ref 0.1–1.0)
Monocytes Relative: 4 %
NEUTROS PCT: 78 %
Neutro Abs: 12.6 10*3/uL — ABNORMAL HIGH (ref 1.7–7.7)

## 2017-11-24 LAB — GROUP B STREP BY PCR: Group B strep by PCR: NEGATIVE

## 2017-11-24 LAB — URINALYSIS, ROUTINE W REFLEX MICROSCOPIC
Bilirubin Urine: NEGATIVE
GLUCOSE, UA: NEGATIVE mg/dL
Hgb urine dipstick: NEGATIVE
Ketones, ur: 20 mg/dL — AB
Nitrite: NEGATIVE
Protein, ur: NEGATIVE mg/dL
Specific Gravity, Urine: 1.003 — ABNORMAL LOW (ref 1.005–1.030)
pH: 7 (ref 5.0–8.0)

## 2017-11-24 LAB — CBC
HCT: 38.2 % (ref 36.0–46.0)
Hemoglobin: 13.3 g/dL (ref 12.0–15.0)
MCH: 33.3 pg (ref 26.0–34.0)
MCHC: 34.8 g/dL (ref 30.0–36.0)
MCV: 95.7 fL (ref 78.0–100.0)
PLATELETS: 258 10*3/uL (ref 150–400)
RBC: 3.99 MIL/uL (ref 3.87–5.11)
RDW: 13 % (ref 11.5–15.5)
WBC: 16.4 10*3/uL — ABNORMAL HIGH (ref 4.0–10.5)

## 2017-11-24 LAB — TYPE AND SCREEN
ABO/RH(D): A POS
ANTIBODY SCREEN: NEGATIVE

## 2017-11-24 MED ORDER — BETAMETHASONE SOD PHOS & ACET 6 (3-3) MG/ML IJ SUSP
12.0000 mg | INTRAMUSCULAR | Status: AC
  Administered 2017-11-24 – 2017-11-25 (×2): 12 mg via INTRAMUSCULAR
  Filled 2017-11-24 (×2): qty 2

## 2017-11-24 MED ORDER — ACETAMINOPHEN 325 MG PO TABS: 650.0000 mg | ORAL_TABLET | ORAL | Status: DC | PRN

## 2017-11-24 MED ORDER — ZOLPIDEM TARTRATE 5 MG PO TABS: 5.0000 mg | ORAL_TABLET | Freq: Every evening | ORAL | Status: DC | PRN

## 2017-11-24 MED ORDER — CALCIUM CARBONATE ANTACID 500 MG PO CHEW: 2.0000 | CHEWABLE_TABLET | ORAL | Status: DC | PRN

## 2017-11-24 MED ORDER — LACTATED RINGERS IV SOLN
INTRAVENOUS | Status: DC
  Administered 2017-11-24 – 2017-11-25 (×2): via INTRAVENOUS

## 2017-11-24 MED ORDER — NIFEDIPINE 10 MG PO CAPS
20.0000 mg | ORAL_CAPSULE | Freq: Four times a day (QID) | ORAL | Status: DC
  Administered 2017-11-24: 20 mg via ORAL
  Filled 2017-11-24: qty 2

## 2017-11-24 MED ORDER — LACTATED RINGERS IV BOLUS
1000.0000 mL | Freq: Once | INTRAVENOUS | Status: AC
  Administered 2017-11-24: 1000 mL via INTRAVENOUS

## 2017-11-24 MED ORDER — DOCUSATE SODIUM 100 MG PO CAPS
100.0000 mg | ORAL_CAPSULE | Freq: Every day | ORAL | Status: DC
  Filled 2017-11-24: qty 1

## 2017-11-24 MED ORDER — LACTATED RINGERS IV BOLUS
500.0000 mL | Freq: Once | INTRAVENOUS | Status: AC
  Administered 2017-11-24: 23:00:00 via INTRAVENOUS

## 2017-11-24 NOTE — H&P (Signed)
ANTEPARTUM ADMISSION HISTORY AND PHYSICAL NOTE   History of Present Illness: Misty [name] is a 27 y.o. G1P0 at [redacted]w[redacted]d admitted for observation for threatened preterm labor and decreased fetal movement.  She was seen at the office previously for leaking milky white fluid this morning and decreased fetal movement. Her NST at the office was non-reactive, fern was negative, and wet prep showed moderate yeast and hyphae.  Cervix was closed on initial exam. FFN not sent due to She reports constant low back pain for the past few days, but denies contractions.  She denies abnormal vaginal discharge, vaginal bleeding, dysuria, or LOF since being in MAU.  She now reports some menstrual-like cramping.    Prenatal care: [facility] since 27 weeks; prior uncomplicated prenatal care at [facility]   Primary: D. Renae Fickle, CNM  Prenatal complications:  - Threatened preterm labor at 32+1 weeks  - Yeast vaginitis   Prenatal labs: ABO/RH: A Positive Antibody: negative Rubella: Immune HIV- NR RPR- NR Hep B - negative 1 hr GTT: 100 GBS: pending    Patient Active Problem List   Diagnosis Date Noted  . Preterm labor in third trimester 11/24/2017  . Palpitations 07/29/2013    Past Medical History:  Diagnosis Date  . Fluttering heart   . Migraines   . SOB (shortness of breath)     History reviewed. No pertinent surgical history.  OB History  Gravida Para Term Preterm AB Living  1            SAB TAB Ectopic Multiple Live Births               # Outcome Date GA Lbr Len/2nd Weight Sex Delivery Anes PTL Lv  1 Current             Social History   Socioeconomic History  . Marital status: Single    Spouse name: Not on file  . Number of children: Not on file  . Years of education: Not on file  . Highest education level: Not on file  Occupational History  . Not on file  Social Needs  . Financial resource strain: Not on file  . Food insecurity:    Worry: Not on file    Inability: Not on  file  . Transportation needs:    Medical: Not on file    Non-medical: Not on file  Tobacco Use  . Smoking status: Former Games developer  . Smokeless tobacco: Never Used  Substance and Sexual Activity  . Alcohol use: Yes    Alcohol/week: 0.0 oz    Comment: occasional  . Drug use: No  . Sexual activity: Not on file  Lifestyle  . Physical activity:    Days per week: Not on file    Minutes per session: Not on file  . Stress: Not on file  Relationships  . Social connections:    Talks on phone: Not on file    Gets together: Not on file    Attends religious service: Not on file    Active member of club or organization: Not on file    Attends meetings of clubs or organizations: Not on file    Relationship status: Not on file  Other Topics Concern  . Not on file  Social History Narrative  . Not on file    Family History  Problem Relation Age of Onset  . Thyroid disease Mother   . Hypertension Father   . Heart disease Maternal Grandmother   . Hypertension Maternal  Grandfather   . CVA Paternal Grandfather     Allergies  Allergen Reactions  . Fish Allergy Swelling    Medications Prior to Admission  Medication Sig Dispense Refill Last Dose  . calcium carbonate (TUMS - DOSED IN MG ELEMENTAL CALCIUM) 500 MG chewable tablet Chew 1 tablet by mouth daily as needed for indigestion or heartburn.   11/23/2017 at Unknown time  . Prenatal Vit-Fe Fumarate-FA (PRENATAL MULTIVITAMIN) TABS tablet Take 1 tablet by mouth 2 (two) times daily.   11/24/2017 at Unknown time  . ondansetron (ZOFRAN) 4 MG tablet Take 1 tablet (4 mg total) by mouth every 8 (eight) hours as needed for nausea or vomiting. (Patient not taking: Reported on 11/24/2017) 12 tablet 0 Not Taking at Unknown time  . propranolol (INDERAL) 10 MG tablet Take 1 tablet (10 mg total) by mouth daily as needed. (Patient not taking: Reported on 11/24/2017) 20 tablet 1 Not Taking at Unknown time    Review of Systems -  Negative except: low back pain;  menstrual-like cramping, decreased fetal movement   Vitals:  BP 117/68   Pulse 88   Temp 98.3 F (36.8 C)   Resp 18   Ht  (1.651 m)   Wt 59 kg (130 lb)   LMP 04/13/2017 (Exact Date)   BMI 21.63 kg/m  Physical Examination: CONSTITUTIONAL: Well-developed, well-nourished female in no acute distress.  HENT:  Normocephalic, atraumatic, External right and left ear normal. Oropharynx is clear and moist EYES: Conjunctivae and EOM are normal. Pupils are equal, round, and reactive to light. No scleral icterus.  NECK: Normal range of motion, supple, no masses SKIN: Skin is warm and dry. No rash noted. Not diaphoretic. No erythema. No pallor. NEUROLGIC: Alert and oriented to person, place, and time. Normal reflexes, muscle tone coordination. No cranial nerve deficit noted. PSYCHIATRIC: Normal mood and affect. Normal behavior. Normal judgment and thought content. CARDIOVASCULAR: Normal heart rate noted, regular rhythm RESPIRATORY: Effort and breath sounds normal, no problems with respiration noted ABDOMEN: Soft, nontender, nondistended, gravid. MUSCULOSKELETAL: Normal range of motion. No edema and no tenderness. 2+ distal pulses.  Cervix: closed/70% effaced/ vtx Membranes:intact Fetal Monitoring: baseline: 145 bpm/ moderate variability/ +15x15 accels/ no decels  Tocometer: every 2-3 minutes with uterine irritability/mild to palpation   Labs:  Results for orders placed or performed during the hospital encounter of 11/24/17 (from the past 24 hour(s))  Urinalysis, Routine w reflex microscopic   Collection Time: 11/24/17  8:22 PM  Result Value Ref Range   Color, Urine STRAW (A) YELLOW   APPearance CLEAR CLEAR   Specific Gravity, Urine 1.003 (L) 1.005 - 1.030   pH 7.0 5.0 - 8.0   Glucose, UA NEGATIVE NEGATIVE mg/dL   Hgb urine dipstick NEGATIVE NEGATIVE   Bilirubin Urine NEGATIVE NEGATIVE   Ketones, ur 20 (A) NEGATIVE mg/dL   Protein, ur NEGATIVE NEGATIVE mg/dL   Nitrite NEGATIVE  NEGATIVE   Leukocytes, UA TRACE (A) NEGATIVE   RBC / HPF 0-5 0 - 5 RBC/hpf   WBC, UA 0-5 0 - 5 WBC/hpf   Bacteria, UA RARE (A) NONE SEEN   Squamous Epithelial / LPF 0-5 0 - 5  Type and screen [facility]   Collection Time: 11/24/17  8:43 PM  Result Value Ref Range   ABO/RH(D) A POS    Antibody Screen NEG    Sample Expiration      11/27/2017 Performed at [facility], [address], [place], [place] [address]   CBC  on admission   Collection Time: 11/24/17  8:44 PM  Result Value Ref Range   WBC 16.4 (H) 4.0 - 10.5 K/uL   RBC 3.99 3.87 - 5.11 MIL/uL   Hemoglobin 13.3 12.0 - 15.0 g/dL   HCT 16.1 09.6 - 04.5 %   MCV 95.7 78.0 - 100.0 fL   MCH 33.3 26.0 - 34.0 pg   MCHC 34.8 30.0 - 36.0 g/dL   RDW 40.9 81.1 - 91.4 %   Platelets 258 150 - 400 K/uL  Differential   Collection Time: 11/24/17  8:44 PM  Result Value Ref Range   Neutrophils Relative % 78 %   Neutro Abs 12.6 (H) 1.7 - 7.7 K/uL   Lymphocytes Relative 18 %   Lymphs Abs 2.9 0.7 - 4.0 K/uL   Monocytes Relative 4 %   Monocytes Absolute 0.6 0.1 - 1.0 K/uL   Eosinophils Relative 0 %   Eosinophils Absolute 0.1 0.0 - 0.7 K/uL   Basophils Relative 0 %   Basophils Absolute 0.0 0.0 - 0.1 K/uL  Group B strep by PCR   Collection Time: 11/24/17  9:11 PM  Result Value Ref Range   Group B strep by PCR NEGATIVE NEGATIVE    Imaging Studies: BPP: 8/10 (-2 for breathing) Cervix: 2.36cm with U-shaped funneling at internal os  Cephalic Posterior placenta   Assessment and Plan: Patient Active Problem List   Diagnosis Date Noted  . Preterm labor in third trimester 11/24/2017  . Palpitations 07/29/2013   1. Admit to [facility] for threatened preterm labor at 32+1 weeks        - Betamethasone x 2 doses       - Procardia  every 6 hours         - Routine antenatal orders       - Routine vital signs; monitor temperature closely       - Growth scan ordered for 11/25/17       - GBS by PCR       -  GC/chlamydia        - CBC with differential        - LR bolus 1L x 1; then LR at 165mL/hr        - Urinalysis   Consult/co-management with Dr. Roosvelt Maser, MSN, CNM  [facility]

## 2017-11-24 NOTE — MAU Note (Signed)
Sent from office for non reactive NST after c/o decreased fetal movement. Prolonged monitoring and BPP.

## 2017-11-25 ENCOUNTER — Inpatient Hospital Stay (HOSPITAL_COMMUNITY): Payer: Federal, State, Local not specified - PPO

## 2017-11-25 ENCOUNTER — Other Ambulatory Visit: Payer: Self-pay

## 2017-11-25 DIAGNOSIS — Z3A32 32 weeks gestation of pregnancy: Secondary | ICD-10-CM | POA: Diagnosis not present

## 2017-11-25 DIAGNOSIS — O368130 Decreased fetal movements, third trimester, not applicable or unspecified: Secondary | ICD-10-CM | POA: Diagnosis not present

## 2017-11-25 LAB — FETAL FIBRONECTIN: FETAL FIBRONECTIN: NEGATIVE

## 2017-11-25 LAB — GC/CHLAMYDIA PROBE AMP (~~LOC~~) NOT AT ARMC
CHLAMYDIA, DNA PROBE: NEGATIVE
Neisseria Gonorrhea: NEGATIVE

## 2017-11-25 LAB — ABO/RH: ABO/RH(D): A POS

## 2017-11-25 MED ORDER — CLOTRIMAZOLE 1 % VA CREA
1.0000 | TOPICAL_CREAM | Freq: Every day | VAGINAL | Status: DC
  Filled 2017-11-25: qty 45

## 2017-11-25 MED ORDER — LACTATED RINGERS IV BOLUS
500.0000 mL | Freq: Once | INTRAVENOUS | Status: AC
  Administered 2017-11-25: 500 mL via INTRAVENOUS

## 2017-11-25 NOTE — Progress Notes (Signed)
Subjective: Patient notes mild abdominal cramping that she can see him the monitor as contractions though she has been sensing these as fetal movement. Patient notes additional fetal movement at other times. Patient reports no severe abdominal pain. Patient reports no leakage of fluid, no vaginal bleeding, no fevers.  Overnight events: Given concern for preterm labor patient was admitted for betamethasone and toco lysis. After her Procardia patient noted acute anxiety and heart rate into the 150s. EKG was done with findings of sinus tachycardia, which patient has been evaluated in the past for by cardiology. After patient was given time to rest and relax her tachycardia has since resumed. Given that event patient declined additional attempts at [facility] lysis with magnesium.  Physical exam: Vitals:   11/25/17 0803 11/25/17 0813 11/25/17 1234 11/25/17 1335  BP:  106/71 114/67 102/61  Pulse:  91 88 99  Resp:  Temp:  98.7 F (37.1 C) 98.6 F (37 C) 98.2 F (36.8 C)  TempSrc:  Oral Oral   SpO2: 100% 100% 100% 100%  Weight:      Height:       General: Well-appearing no distress Abdomen: Gravid, no fundal tenderness, no right upper quadrant pain Back: No costovertebral angle tenderness Lower extremities: No edema, no swelling GU: Deferred Toco: Contractions every 10 minutes FHTs: 135, positive accelerations, no decelerations, 10 beat variability assessment and plan  GBS negative CBC    Component Value Date/Time   WBC 16.4 (H) 11/24/2017 2044   RBC 3.99 11/24/2017 2044   HGB 13.3 11/24/2017 2044   HCT 38.2 11/24/2017 2044   PLT 258 11/24/2017 2044   MCV 95.7 11/24/2017 2044   MCH 33.3 11/24/2017 2044   MCHC 34.8 11/24/2017 2044   RDW 13.0 11/24/2017 2044   LYMPHSABS 2.9 11/24/2017 2044   MONOABS 0.6 11/24/2017 2044   EOSABS 0.1 11/24/2017 2044   BASOSABS 0.0 11/24/2017 2044   Ultrasound: Growth 4'2, 50th percentile, AFI  10, vertex, posterior placenta BPP yesterday: 8 out  of 10, -2 for breathing, reactive NST  Gonorrhea and chlamydia: Not yet sent  Assessment and plan: 27 year old G1 at 32 weeks and 2 days here with preterm contractions, cervical funneling and early cervical effacement. Contractions have slowed on the monitor with hydration. Patient did not tolerate 20 mg of Procardia and refused attempt at magnesium. Will keep patient on bedrest at current time. Plan FFN at around 10 PM, or 24 hours from her last cervical exam. After her FFN is collected will plan a repeat cervical check. Can perform a repeat cervical check with increased painful contractions at any time. Should her FSN be negative patient is fine for discharge to home either late tonight or tomorrow morning after recheck. If FF and is positive will consider trial of Procardia at a much lower dose. Consider 5 mg to see if patient can tolerate this. Patient should be home on modified bedrest with close outpatient follow-up.  - Reactive fetal testing. Risks of prematurity. Second betamethasone due tonight  - Elevated white count. Possible leukocytosis of pregnancy. Given young age will re-evaluate for gonorrhea and chlamydia. No evidence of chorioamnionitis given no fever, no uterine tenderness.  Lendon Colonel 11/25/2017 2:42 PM

## 2017-11-25 NOTE — Progress Notes (Signed)
Called [name] CNM, pt c/o chest discomfort, SOB, wanting to see the dr.   Pulse 128 then increased to 150's when pt saw pulse was 128.  Attempted to calm pt down, requesting to see the dr.

## 2017-11-25 NOTE — Progress Notes (Signed)
Interval note ANTEPARTUM NOTE - Hospital Day #1 Seen by Dr. Ernestina Penna this morning for rounds, discussed plan of care - FFN at 10pm, which is 24 hours after last cervical exam and repeat cervical exam.  Mother at bedside for exam and visit.   Subjective: Reports feeling well; denies contractions today; reports good fetal movement.  Feels comfortable with plan and what to expect.  Is hopeful to go home tomorrow.  States she does work at her [employer], so she should be able to reduce hours or be out of work based on contractions and FFN results.  Tolerating po intake / no nausea / no vomiting  Voiding QS Denies VB or LOF States she has not had any problems with tachycardia today   Objective: Vital signs: VS: Blood pressure (!) 110/59, pulse 88, temperature 98.4 F (36.9 C), temperature source Oral, resp. rate 18, height  (1.651 m), weight 59 kg (130 lb), last menstrual period 04/13/2017, SpO2 100 %.   Physical exam:        General appearance/behavior: AOx3; calm       Speculum examination: yellow discharge noted known yeast infection)       Bimanual exam / VE: closed/ 70%/-3/posterior        Fetal Assessment:  FHR: baseline: 145 bpm/ moderate variability/ +accels 15x15/ no decels  TOCO: occasional UI; no regular contractions since this morning    Ultrasound: Growth 4'2, 50th percentile, AFI  10, vertex, posterior placenta BPP yesterday: 8 out of 10, -2 for breathing, reactive NST  Results for orders placed or performed during the hospital encounter of 11/24/17 (from the past 24 hour(s))  Fetal fibronectin     Status: None   Collection Time: 11/25/17 10:30 PM  Result Value Ref Range   Fetal Fibronectin NEGATIVE NEGATIVE   Gonorrhea and chlamydia: negative    Assessment: 32+[redacted] weeks gestation FHR category 1 Threatened preterm labor    Plan:  1. FFN collected (Negative) and cervix unchanged 2. Okay to discontinue continuous monitoring after FFN results; will  plan for NST in the morning 3. Encouraged PO hydration 4. Continue Terazol 7 cream for yeast infection   5. Plan for discharge home in the morning  Carlean Jews, MSN, CNM [facility]

## 2017-11-25 NOTE — Progress Notes (Addendum)
Late entry note from 11/24/17@ 1030pm Received call from RN stating patient's HR is 150 bpm and she has some chest discomfort and SOB.  Pt. Received Procardia  about an hour prior to this.    O:  VS: Blood pressure (!) 109/56, pulse (!) 128, temperature 98.2 F (36.8 C), temperature source Oral, resp. rate 18, height  (1.651 m), weight 59 kg (130 lb), last menstrual period 04/13/2017, SpO2 100 %.        FHR : baseline 150 bpm / variability moderate / accelerations + / no decelerations        Toco: contractions were every 1-3 minutes, but now every 4-7 minutes with some UI / mild        Cervix : deferred        Membranes: intact  A: Threatened preterm labor at 32+1 weeks     FHR category 1     Yeast vaginitis      GBS Negative   P: IVF bolus x 1     Continuous pulse oximetry      EKG stat    Clotrimazole cream x 7 nights   Dr. Ernestina Penna updated with reaction.  Discussed starting Magnesium sulfate for tocolysis and neuroprotection.  I discussed risks, benefits, and potential adverse reactions.  Pt. States declines magnesium sulfate and procardia against medical advice.      Carlean Jews, MSN, CNM [facility]

## 2017-11-25 NOTE — Progress Notes (Signed)
Carlean Jews CNM at pt bedside talking with pt and her mother.reveiwed fhr and ctx pattern will speak with dr. Ernestina Penna.

## 2017-11-26 DIAGNOSIS — Z3A32 32 weeks gestation of pregnancy: Secondary | ICD-10-CM | POA: Diagnosis not present

## 2017-11-26 NOTE — Progress Notes (Signed)
HD#2 PTL, FFN neg  Subjective: Patient notes mild abdominal cramping that she can see him the monitor as contractions though she has been sensing these as fetal movement. Patient notes additional fetal movement at other times. Patient reports no severe abdominal pain. Patient reports no leakage of fluid, no vaginal bleeding, no fevers.  Physical exam: Vitals:   11/25/17 2018 11/25/17 2328 11/26/17 0435 11/26/17 0735  BP: (!) 110/59 105/63 99/63 97/71   Pulse: 88 92 78 85  Resp: Temp: 98.4 F (36.9 C) 98.3 F (36.8 C) 98.4 F (36.9 C) 98.3 F (36.8 C)  TempSrc: Oral Oral Oral Oral  SpO2: 100% 100% 99% 100%  Weight:      Height:       General: Well-appearing no distress NCAT Lungs: CTA CV:RRR Abdomen: Gravid, no fundal tenderness, no right upper quadrant pain Back: No costovertebral angle tenderness Lower extremities: No edema, no swelling GU: Deferred EXT: no cords Neuro: non focal Skin Intact  Toco: Contractions irregular FHTs: 135, positive accelerations, no decelerations, 10 beat variability  Category 1 tracing x 3  GBS negative FFN neg  CBC    Component Value Date/Time   WBC 16.4 (H) 11/24/2017 2044   RBC 3.99 11/24/2017 2044   HGB 13.3 11/24/2017 2044   HCT 38.2 11/24/2017 2044   PLT 258 11/24/2017 2044   MCV 95.7 11/24/2017 2044   MCH 33.3 11/24/2017 2044   MCHC 34.8 11/24/2017 2044   RDW 13.0 11/24/2017 2044   LYMPHSABS 2.9 11/24/2017 2044   MONOABS 0.6 11/24/2017 2044   EOSABS 0.1 11/24/2017 2044   BASOSABS 0.0 11/24/2017 2044   Ultrasound: Growth 4'2, 50th percentile, AFI  10, vertex, posterior placenta BPP yesterday: 8 out of 10, -2 for breathing, reactive NST  Gonorrhea and chlamydia: Not yet sent  Assessment and plan:  PTL- stable. No cervical change. FFN neg  Reactive fetal testing. Risks of prematurity. BMZ complete  DC home O[facility] until seen Friday PTL precautions  Misty [name] J 11/26/2017 9:07 AM

## 2017-11-26 NOTE — Progress Notes (Signed)
Discharge instructions reviewed. Patient to follow up this Friday. IV removed, paperwork signed.

## 2017-11-28 DIAGNOSIS — Z3A32 32 weeks gestation of pregnancy: Secondary | ICD-10-CM | POA: Diagnosis not present

## 2017-11-28 NOTE — Discharge Summary (Signed)
Patient ID: Misty [name] MRN: [id number] DOB/AGE: January 28, 1991 26 y.o.  Admit date: 11/24/2017 Admission Diagnoses: Preterm labor at 32+1 weeks  Discharge date: 11/26/2017  Discharge Diagnoses: Threatened preterm labor at 32+1 weeks   Prenatal history: G1P0   EDC : 01/18/2018, Date entered prior to episode creation  Prenatal care: [facility] since 27 weeks; prior uncomplicated prenatal care at [facility]   Primary: D. Renae Fickle, CNM  Prenatal complications:  - Threatened preterm labor at 32+1 weeks  - Yeast vaginitis    Prenatal Labs: ABO/RH: A Positive Antibody: negative Rubella: Immune HIV- NR RPR- NR Hep B - negative 1 hr GTT: 100 GBS: negative GC/Chlam: negative FFN: Negative  Medical / Surgical History :  Past medical history:  Past Medical History:  Diagnosis Date  . Fluttering heart   . Migraines   . SOB (shortness of breath)     Past surgical history: History reviewed. No pertinent surgical history.  Family History:  Family History  Problem Relation Age of Onset  . Thyroid disease Mother   . Hypertension Father   . Heart disease Maternal Grandmother   . Hypertension Maternal Grandfather   . CVA Paternal Grandfather     Social History:  reports that she has quit smoking. She has never used smokeless tobacco. She reports that she drinks alcohol. She reports that she does not use drugs.  Allergies: Fish allergy   Current Medications at time of admission:  Prior to Admission medications   Medication Sig Start Date End Date Taking? Authorizing Provider  calcium carbonate (TUMS - DOSED IN MG ELEMENTAL CALCIUM) 500 MG chewable tablet Chew 1 tablet by mouth daily as needed for indigestion or heartburn.   Yes [provider]  Prenatal Vit-Fe Fumarate-FA (PRENATAL MULTIVITAMIN) TABS tablet Take 1 tablet by mouth 2 (two) times daily.   Yes [provider]  ondansetron (ZOFRAN) 4 MG tablet Take 1 tablet (4 mg total) by mouth every 8 (eight) hours  as needed for nausea or vomiting. Patient not taking: Reported on 11/24/2017 07/23/17   Raeford Razor, MD    Hospital Course:  Misty [name] is a 27 y.o. G1P0 at [redacted]w[redacted]d admitted for observation for threatened preterm labor and decreased fetal movement.  She was seen at the office previously for leaking milky white fluid this morning and decreased fetal movement. Her NST at the office was non-reactive, fern was negative, and wet prep showed moderate yeast and hyphae.  Cervix was closed on initial exam. Admitted for observation for threatened preterm labor. FFN negative, GBS negative, GC/Chlam negative. Small cervical change from closed/thick to closed/70% effaced.  Procardia  PO x 1 given and patient reports tachycardia following dose -pt. Declined further intervention for PTL . Cervical funneling on sono noted. BMZ course completed. By day 2, FFN negative and no further cervical change.  D/C home with precautions.    Discharge Instructions:  Discharged Condition: stable  Activity: pelvic rest and  modified bed rest until next appt on Friday  Diet: routine and low sodium   Medications: PNV Allergies as of 11/26/2017      Reactions   Fish Allergy Swelling      Medication List    STOP taking these medications   propranolol 10 MG tablet Commonly known as:  INDERAL     TAKE these medications   calcium carbonate 500 MG chewable tablet Commonly known as:  TUMS - dosed in mg elemental calcium Chew 1 tablet by mouth daily as needed for indigestion or heartburn.  ondansetron 4 MG tablet Commonly known as:  ZOFRAN Take 1 tablet (4 mg total) by mouth every 8 (eight) hours as needed for nausea or vomiting.   prenatal multivitamin Tabs tablet Take 1 tablet by mouth 2 (two) times daily.       Discharge Instructions:  - Preterm/ labor signs reviewed  - Fetal kick counts - Pelvic rest - Modified bed rest - Increase water intake   Discharge to: Home  Follow up : Friday 5/10 with D.  Renae Fickle, CNM   Signed: Carlean Jews, MSN, CNM [facility]

## 2017-12-01 ENCOUNTER — Other Ambulatory Visit: Payer: Self-pay

## 2017-12-01 ENCOUNTER — Observation Stay (HOSPITAL_COMMUNITY)
Admission: AD | Admit: 2017-12-01 | Discharge: 2017-12-02 | Disposition: A | Discharge status: Home / Self Care | Payer: Federal, State, Local not specified - PPO | Source: Ambulatory Visit | Attending: Obstetrics and Gynecology | Admitting: Obstetrics and Gynecology

## 2017-12-01 DIAGNOSIS — O99413 Diseases of the circulatory system complicating pregnancy, third trimester: Secondary | ICD-10-CM

## 2017-12-01 DIAGNOSIS — Z79899 Other long term (current) drug therapy: Secondary | ICD-10-CM | POA: Insufficient documentation

## 2017-12-01 DIAGNOSIS — Z3A33 33 weeks gestation of pregnancy: Secondary | ICD-10-CM | POA: Insufficient documentation

## 2017-12-01 LAB — URINALYSIS, ROUTINE W REFLEX MICROSCOPIC
BILIRUBIN URINE: NEGATIVE
Bacteria, UA: NONE SEEN
Glucose, UA: NEGATIVE mg/dL
Hgb urine dipstick: NEGATIVE
Ketones, ur: NEGATIVE mg/dL
Nitrite: NEGATIVE
PH: 7 (ref 5.0–8.0)
Protein, ur: NEGATIVE mg/dL
SPECIFIC GRAVITY, URINE: 1.001 — AB (ref 1.005–1.030)

## 2017-12-01 LAB — CBC
HCT: 36.3 % (ref 36.0–46.0)
Hemoglobin: 12.5 g/dL (ref 12.0–15.0)
MCH: 32.9 pg (ref 26.0–34.0)
MCHC: 34.4 g/dL (ref 30.0–36.0)
MCV: 95.5 fL (ref 78.0–100.0)
PLATELETS: 286 10*3/uL (ref 150–400)
RBC: 3.8 MIL/uL — AB (ref 3.87–5.11)
RDW: 13 % (ref 11.5–15.5)
WBC: 17.2 10*3/uL — AB (ref 4.0–10.5)

## 2017-12-01 LAB — WET PREP, GENITAL
CLUE CELLS WET PREP: NONE SEEN
SPERM: NONE SEEN
Trich, Wet Prep: NONE SEEN

## 2017-12-01 LAB — AMNISURE RUPTURE OF MEMBRANE (ROM) NOT AT ARMC: Amnisure ROM: NEGATIVE

## 2017-12-01 LAB — FETAL FIBRONECTIN: Fetal Fibronectin: POSITIVE — AB

## 2017-12-01 MED ORDER — CLOTRIMAZOLE 1 % VA CREA
1.0000 | TOPICAL_CREAM | Freq: Every day | VAGINAL | Status: DC
  Administered 2017-12-01: 1 via VAGINAL
  Filled 2017-12-01: qty 45

## 2017-12-01 MED ORDER — DOCUSATE SODIUM 100 MG PO CAPS: 100.0000 mg | ORAL_CAPSULE | Freq: Every day | ORAL | Status: DC

## 2017-12-01 MED ORDER — NIFEDIPINE 10 MG PO CAPS
10.0000 mg | ORAL_CAPSULE | Freq: Three times a day (TID) | ORAL | Status: DC
  Administered 2017-12-01: 10 mg via ORAL
  Filled 2017-12-01: qty 1

## 2017-12-01 MED ORDER — PRENATAL MULTIVITAMIN CH: 1.0000 | ORAL_TABLET | Freq: Every day | ORAL | Status: DC

## 2017-12-01 MED ORDER — NIFEDIPINE 10 MG PO CAPS
10.0000 mg | ORAL_CAPSULE | Freq: Four times a day (QID) | ORAL | Status: DC
  Administered 2017-12-02: 10 mg via ORAL
  Filled 2017-12-01 (×2): qty 1

## 2017-12-01 MED ORDER — NIFEDIPINE 10 MG PO CAPS
10.0000 mg | ORAL_CAPSULE | Freq: Once | ORAL | Status: AC
  Administered 2017-12-01: 10 mg via ORAL
  Filled 2017-12-01: qty 1

## 2017-12-01 MED ORDER — ZOLPIDEM TARTRATE 5 MG PO TABS: 5.0000 mg | ORAL_TABLET | Freq: Every evening | ORAL | Status: DC | PRN

## 2017-12-01 MED ORDER — ACETAMINOPHEN 325 MG PO TABS
650.0000 mg | ORAL_TABLET | ORAL | Status: DC | PRN
  Administered 2017-12-02: 650 mg via ORAL
  Filled 2017-12-01: qty 2

## 2017-12-01 MED ORDER — CALCIUM CARBONATE ANTACID 500 MG PO CHEW: 2.0000 | CHEWABLE_TABLET | ORAL | Status: DC | PRN

## 2017-12-01 NOTE — MAU Provider Note (Signed)
History     CSN: [id number]  Arrival date and time: 12/01/17 1152   First Provider Initiated Contact with Patient 12/01/17 1239      Chief Complaint  Patient presents with  . Contractions   HPI Misty [name] is a 27 yo G1P0 at 33+1 weeks here for threatened preterm labor.  She was admitted for observation last week for preterm labor contractions and U-shaped funneling of cervix on ultrasound.  Her FFN was negative on Tuesday, and she was sent home on Wednesday morning with contractions. She received BMZ on 5/6 and 5/7.  On Friday, her cervix was still closed/50%.  She reports yesterday feeling some "tightening" and spoke to the on call midwife and tried to rest and hydrate.  Today, she reports contractions are strong every 2-3 minutes with some rectal pressure.  She reports her underwear was wet today, but has not been monitoring for LOF.  She denies VB, dysuria.  She has been on pelvic rest (FOB not in picture) and states she has been resting at home.  She states she did not use the yeast infection cream that was prescribed due to fear of fetal risks.  She had one dose of Procardia  last week and had a tachycardic reaction and now is refusing Procardia.  She is also refusing IVFs.   OB History    Gravida  1   Para      Term      Preterm      AB      Living        SAB      TAB      Ectopic      Multiple      Live Births              Past Medical History:  Diagnosis Date  . Fluttering heart   . Migraines   . SOB (shortness of breath)     No past surgical history on file.  Family History  Problem Relation Age of Onset  . Thyroid disease Mother   . Hypertension Father   . Heart disease Maternal Grandmother   . Hypertension Maternal Grandfather   . CVA Paternal Grandfather     Social History   Tobacco Use  . Smoking status: Former Games developer  . Smokeless tobacco: Never Used  Substance Use Topics  . Alcohol use: Yes    Alcohol/week: 0.0 oz    Comment:  occasional  . Drug use: No    Allergies:  Allergies  Allergen Reactions  . Fish Allergy Swelling    Medications Prior to Admission  Medication Sig Dispense Refill Last Dose  . calcium carbonate (TUMS - DOSED IN MG ELEMENTAL CALCIUM) 500 MG chewable tablet Chew 1 tablet by mouth daily as needed for indigestion or heartburn.   11/23/2017 at Unknown time  . ondansetron (ZOFRAN) 4 MG tablet Take 1 tablet (4 mg total) by mouth every 8 (eight) hours as needed for nausea or vomiting. (Patient not taking: Reported on 11/24/2017) 12 tablet 0 Not Taking at Unknown time  . Prenatal Vit-Fe Fumarate-FA (PRENATAL MULTIVITAMIN) TABS tablet Take 1 tablet by mouth 2 (two) times daily.   11/24/2017 at Unknown time    Review of Systems  Constitutional: Negative.   HENT: Negative.   Respiratory: Negative.   Gastrointestinal: Positive for abdominal pain.       Menstrual-like cramping    Physical Exam   Blood pressure 117/81, pulse (!) 118, temperature 99 F (37.2 C),  temperature source Oral, resp. rate 17, height  (1.651 m), weight 57.2 kg (126 lb), last menstrual period 04/13/2017, SpO2 100 %.  Physical Exam Dilation: 1 Effacement (%): 80 Station: -1 Presentation: Vertex Exam by:: Carlean Jews Results for orders placed or performed during the hospital encounter of 12/01/17 (from the past 24 hour(s))  Urinalysis, Routine w reflex microscopic     Status: Abnormal   Collection Time: 12/01/17 11:55 AM  Result Value Ref Range   Color, Urine STRAW (A) YELLOW   APPearance CLEAR CLEAR   Specific Gravity, Urine 1.001 (L) 1.005 - 1.030   pH 7.0 5.0 - 8.0   Glucose, UA NEGATIVE NEGATIVE mg/dL   Hgb urine dipstick NEGATIVE NEGATIVE   Bilirubin Urine NEGATIVE NEGATIVE   Ketones, ur NEGATIVE NEGATIVE mg/dL   Protein, ur NEGATIVE NEGATIVE mg/dL   Nitrite NEGATIVE NEGATIVE   Leukocytes, UA MODERATE (A) NEGATIVE   WBC, UA 0-5 0 - 5 WBC/hpf   Bacteria, UA NONE SEEN NONE SEEN  Amnisure rupture of  membrane (rom)not at [facility]     Status: None   Collection Time: 12/01/17 12:55 PM  Result Value Ref Range   Amnisure ROM NEGATIVE   Wet prep, genital     Status: Abnormal   Collection Time: 12/01/17 12:55 PM  Result Value Ref Range   Yeast Wet Prep HPF POC PRESENT (A) NONE SEEN   Trich, Wet Prep NONE SEEN NONE SEEN   Clue Cells Wet Prep HPF POC NONE SEEN NONE SEEN   WBC, Wet Prep HPF POC MANY (A) NONE SEEN   Sperm NONE SEEN   Fetal fibronectin     Status: Abnormal   Collection Time: 12/01/17 12:55 PM  Result Value Ref Range   Fetal Fibronectin POSITIVE (A) NEGATIVE  CBC     Status: Abnormal   Collection Time: 12/01/17  3:19 PM  Result Value Ref Range   WBC 17.2 (H) 4.0 - 10.5 K/uL   RBC 3.80 (L) 3.87 - 5.11 MIL/uL   Hemoglobin 12.5 12.0 - 15.0 g/dL   HCT 16.1 09.6 - 04.5 %   MCV 95.5 78.0 - 100.0 fL   MCH 32.9 26.0 - 34.0 pg   MCHC 34.4 30.0 - 36.0 g/dL   RDW 40.9 81.1 - 91.4 %   Platelets 286 150 - 400 K/uL   MAU Course  Procedures  MDM UA/UC, wet prep, FFN, Amnisure, Fern, CBC Continuous fetal monitoring   Assessment and Plan  1. Single IUP at 33+1 with threatened preterm labor     - Pt. Refusing oral Procardia, terbutaline, and IVFs    - Advised pt. It is against medical advice to not intervene for PTL    - Will wait on labs and discuss with Dr. Billy Coast  Consult for plan of care: Dr. Almon Hercules C Shaylee Stanislawski 12/01/2017, 1:06 PM   Reassessment at 3pm:  Pt. States she is still uncomfortable with contractions and "wants to know the plan."  I informed her we can recheck her cervix and discuss interventions again. We reviewed lab results and again discussed options for oral Procardia, terbutaline, IVF and pt. Declines.  We discussed talking with neonatology about outcomes if preterm delivery and she would like to think about it.  I was called to attend a c/s and told the patient I would return after, but to have her nurse call me if she changes her mind about interventions.   If not, we discussed going home against medical advice.  Dilation: 1 Effacement (%):  80 Station: -1 Presentation: Vertex Exam by:: Carlean Jews  Reassessment at 1630: Pt. Still unsure of if wanting intervention, but still contracting.  Questions answered regarding side effects and dosing. States she may be interested trying a low dose of Procardia.  Recommended starting with Procardia  orally.  Pt. Agrees to Procardia and now would like to talk with neonatology.  She states she would like stay and to monitor BP and HR after Procardia.  Reassessment at 1830: Pt. Tolerated Procardia  x 1, but still contracting.  Tearful at this time stating contractions are still uncomfortable.  States she's frustrated that the Procardia didn't work and does not want to take anymore.  I discussed loading dose therapy for Procardia, but pt. Declines.  Requests repeat cervical exam.  Essentially is the same, but loose 1cm/80%/-1/vtx.  Offered IVF, terbutaline vs. Another dose of Procardia, and pain medication - and pt. Declines at this time. Advised I recommend treatment for yeast infection, and now pt. Agreeable to vaginal cream.  Does not want to repeat Procardia.  Discussed with Dr. Billy Coast, and recommend admit overnight for observation and Neonatalogy consult.  Will offer Procardia  XL.  Admit to 3rd floor for observation - pt. Agreeable to admission.   Consult for plan of care: Dr. Ileene Patrick, CNM

## 2017-12-01 NOTE — Consult Note (Signed)
Neonatology Consult to Antenatal Patient:  I was asked by Dr. Idelle Jo to see this patient in order to provide antenatal counseling due to threatening labor with prematurity.  Misty [name] was admitted 5/13 at 33 1/[redacted] weeks GA. She is currently having contractions for which she is receiving Procardia.  She is a G1 at 26yo otherwise healthy.  She received BMZ last week during hospitalization for similar complaint with cervical funneling.  No bleeding and membranes intact.  No fever.   I spoke with the patient and her mother. We discussed the worst case of delivery in the next 1-2 days, including usual DR management, possible respiratory complications and need for support, IV access, feedings (mother desires breast feeding, which was encouraged), LOS, Mortality and Morbidity, and long term outcomes.  I answered her questions at this time and gave her a March of [employer] handout on prematurity. I offered a NICU tour to any interested family members and would be glad to come back if she has more questions later.  Thank you for asking me to see this patient.  Jamie Brookes, MD Neonatologist  The total length of face-to-face or floor/unit time for this encounter was 45 minutes. Counseling and/or coordination of care was 25 minutes of the above.

## 2017-12-01 NOTE — MAU Note (Signed)
Pt reports contractions for the last 24 hours, had some contractions last week and was given steroids last week.

## 2017-12-01 NOTE — Progress Notes (Signed)
Updated Carlean Jews, CNM of pt contracting q 2 minutes. New order received: Procardia  PO q6hrs for contractions.

## 2017-12-01 NOTE — MAU Note (Signed)
Patient still contracting, so far she is not having any side effects from the procardia given at 16:53.

## 2017-12-01 NOTE — H&P (Addendum)
ANTEPARTUM ADMISSION HISTORY AND PHYSICAL NOTE   History of Present Illness:  Misty [name] is a 27 yo G1P0 at 33+1 weeks admitted for observation for threatened preterm labor.  She was admitted for observation last week for preterm labor contractions and U-shaped funneling of cervix on ultrasound.  Her FFN was negative on Tuesday, and she was sent home on Wednesday morning with contractions. She received BMZ on 5/6 and 5/7.  On Friday, her cervix was still closed/50%.  She reports yesterday feeling some "tightening" and spoke to the on call midwife and tried to rest and hydrate.  Today, she reports contractions are strong every 2-3 minutes with some rectal pressure.  Today, FFN was positive with some cervical change since Friday.  Amnisure and fern were negative.  This afternoon, she was agreeable to take Procardia 10mg  x 1, but states if it didn't work she may not try further medication.  However, prior to transfer, pt. Tearful stating contractions were still coming, and agreed to another dose of Procardia 10mg .  After the 2nd dose of Procardia, pt. Having some relief from contractions without side effects, and has agreed to take Procardia 10mg  every 6 hours for contractions.    Prenatal care: [facility] since 27 weeks; prior uncomplicated prenatal care at [facility]   Primary: D. Renae Fickle, CNM  Prenatal complications:  - Threatened preterm labor at 32+1 weeks  - Yeast vaginitis   Prenatal labs: ABO/RH: A Positive Antibody: negative Rubella: Immune HIV- NR RPR- NR Hep B - negative 1 hr GTT: 100 GBS: negative GC/Chlam: negative   Patient Active Problem List   Diagnosis Date Noted  . Preterm labor in third trimester 11/24/2017  . Palpitations 07/29/2013    Past Medical History:  Diagnosis Date  . Fluttering heart   . Migraines   . SOB (shortness of breath)     No past surgical history on file.  OB History  Gravida Para Term Preterm AB Living  1            SAB TAB  Ectopic Multiple Live Births               # Outcome Date GA Lbr Len/2nd Weight Sex Delivery Anes PTL Lv  1 Current             Social History   Socioeconomic History  . Marital status: Single    Spouse name: Not on file  . Number of children: Not on file  . Years of education: Not on file  . Highest education level: Not on file  Occupational History  . Not on file  Social Needs  . Financial resource strain: Not on file  . Food insecurity:    Worry: Not on file    Inability: Not on file  . Transportation needs:    Medical: Not on file    Non-medical: Not on file  Tobacco Use  . Smoking status: Former Games developer  . Smokeless tobacco: Never Used  Substance and Sexual Activity  . Alcohol use: Yes    Alcohol/week: 0.0 oz    Comment: occasional  . Drug use: No  . Sexual activity: Not on file  Lifestyle  . Physical activity:    Days per week: Not on file    Minutes per session: Not on file  . Stress: Not on file  Relationships  . Social connections:    Talks on phone: Not on file    Gets together: Not on file    Attends  religious service: Not on file    Active member of club or organization: Not on file    Attends meetings of clubs or organizations: Not on file    Relationship status: Not on file  Other Topics Concern  . Not on file  Social History Narrative  . Not on file    Family History  Problem Relation Age of Onset  . Thyroid disease Mother   . Hypertension Father   . Heart disease Maternal Grandmother   . Hypertension Maternal Grandfather   . CVA Paternal Grandfather     Allergies  Allergen Reactions  . Fish Allergy Swelling    Medications Prior to Admission  Medication Sig Dispense Refill Last Dose  . calcium carbonate (TUMS - DOSED IN MG ELEMENTAL CALCIUM) 500 MG chewable tablet Chew 1 tablet by mouth daily as needed for indigestion or heartburn.   Past Week at Unknown time  . Prenatal Vit-Fe Fumarate-FA (PRENATAL MULTIVITAMIN) TABS tablet Take 1  tablet by mouth 2 (two) times daily.   12/01/2017 at Unknown time  . ondansetron (ZOFRAN) 4 MG tablet Take 1 tablet (4 mg total) by mouth every 8 (eight) hours as needed for nausea or vomiting. (Patient not taking: Reported on 11/24/2017) 12 tablet 0 Not Taking at Unknown time    Review of Systems - Negative except: painful menstrual-like cramping, contractions, bloody show, vaginal irritation from yeast infection  Vitals:  BP 114/76 (BP Location: Left Arm)   Pulse 98   Temp 99.2 F (37.3 C) (Oral)   Resp 18   Ht  (1.651 m)   Wt 57.2 kg (126 lb)   LMP 04/13/2017 (Exact Date)   SpO2 99%   BMI 20.97 kg/m  Physical Examination: CONSTITUTIONAL: Well-developed, well-nourished female in no acute distress.  HENT:  Normocephalic, atraumatic, External right and left ear normal. Oropharynx is clear and moist EYES: Conjunctivae and EOM are normal. Pupils are equal, round, and reactive to light. No scleral icterus.  NECK: Normal range of motion, supple, no masses SKIN: Skin is warm and dry. No rash noted. Not diaphoretic. No erythema. No pallor. NEUROLGIC: Alert and oriented to person, place, and time. Normal reflexes, muscle tone coordination. No cranial nerve deficit noted. PSYCHIATRIC: Normal mood and affect. Normal behavior. Normal judgment and thought content. CARDIOVASCULAR: Normal heart rate noted, regular rhythm RESPIRATORY: Effort and breath sounds normal, no problems with respiration noted ABDOMEN: Soft, nontender, nondistended, gravid. MUSCULOSKELETAL: Normal range of motion. No edema and no tenderness. 2+ distal pulses.  Sterile speculum: thick, yellow curd-like discharge coating vaginal walls and cervix Cervix: Dilation: 1 Effacement (%): 80 Station: -1 Presentation: Vertex Exam by:: Horatio Pel  Membranes:intact Fetal Monitoring: baseline: 150 bpm/ moderate variability/ +15x15 accels/ no decels  Tocometer: every 2-3 minutes with uterine irritability/mild to  moderate on palpation    Labs:  Results for orders placed or performed during the hospital encounter of 12/01/17 (from the past 24 hour(s))  Urinalysis, Routine w reflex microscopic   Collection Time: 12/01/17 11:55 AM  Result Value Ref Range   Color, Urine STRAW (A) YELLOW   APPearance CLEAR CLEAR   Specific Gravity, Urine 1.001 (L) 1.005 - 1.030   pH 7.0 5.0 - 8.0   Glucose, UA NEGATIVE NEGATIVE mg/dL   Hgb urine dipstick NEGATIVE NEGATIVE   Bilirubin Urine NEGATIVE NEGATIVE   Ketones, ur NEGATIVE NEGATIVE mg/dL   Protein, ur NEGATIVE NEGATIVE mg/dL   Nitrite NEGATIVE NEGATIVE   Leukocytes, UA MODERATE (A) NEGATIVE  WBC, UA 0-5 0 - 5 WBC/hpf   Bacteria, UA NONE SEEN NONE SEEN  Wet prep, genital   Collection Time: 12/01/17 12:55 PM  Result Value Ref Range   Yeast Wet Prep HPF POC PRESENT (A) NONE SEEN   Trich, Wet Prep NONE SEEN NONE SEEN   Clue Cells Wet Prep HPF POC NONE SEEN NONE SEEN   WBC, Wet Prep HPF POC MANY (A) NONE SEEN   Sperm NONE SEEN   Amnisure rupture of membrane (rom)not at [facility]   Collection Time: 12/01/17 12:55 PM  Result Value Ref Range   Amnisure ROM NEGATIVE   Fetal fibronectin   Collection Time: 12/01/17 12:55 PM  Result Value Ref Range   Fetal Fibronectin POSITIVE (A) NEGATIVE  CBC   Collection Time: 12/01/17  3:19 PM  Result Value Ref Range   WBC 17.2 (H) 4.0 - 10.5 K/uL   RBC 3.80 (L) 3.87 - 5.11 MIL/uL   Hemoglobin 12.5 12.0 - 15.0 g/dL   HCT 16.1 09.6 - 04.5 %   MCV 95.5 78.0 - 100.0 fL   MCH 32.9 26.0 - 34.0 pg   MCHC 34.4 30.0 - 36.0 g/dL   RDW 40.9 81.1 - 91.4 %   Platelets 286 150 - 400 K/uL    Imaging Studies: [place] Mfm Fetal Bpp Wo Non Stress  Result Date: 11/25/2017 ----------------------------------------------------------------------  OBSTETRICS REPORT                      (Signed Final 11/25/2017 09:03 am) ---------------------------------------------------------------------- Patient Info  ID #:       [id number]                           D.O.B.:  10-05-1990 (26 yrs)  Name:       Misty [name]                   Visit Date: 11/24/2017 05:52 pm ---------------------------------------------------------------------- Performed By  Performed By:     Lenise Arena        Ref. Address:     [facility]                    RDMS                                                             [address]                                                             [place]                                                             [address]  Attending:        Charlsie Merles MD         [facility]  Referred By:      Karena Addison CNM ---------------------------------------------------------------------- Orders   #  Description                                 Code   1  [place] MFM FETAL BPP WO NON STRESS              76819.01  ----------------------------------------------------------------------   #  Ordered By               Order #        Accession #    Episode #   1  MEREDITH Idelle Jo          [id number]      [id number]     [id number]  ---------------------------------------------------------------------- Indications   [redacted] weeks gestation of pregnancy                Z3A.32   Decreased fetal movement                       O36.8190   Non-reactive NST                               O28.9  ---------------------------------------------------------------------- OB History  Blood Type:            Height:  5'5"   Weight (lb):  130       BMI:  21.63  Gravidity:    1 ---------------------------------------------------------------------- Fetal Evaluation  Num Of Fetuses:     1  Fetal Heart         153  Rate(bpm):  Cardiac Activity:   Observed  Presentation:       Cephalic  Placenta:           Posterior, above cervical os  Amniotic Fluid  AFI FV:      Subjectively within normal limits  AFI Sum(cm)     %Tile       Largest Pocket(cm)  11.57           28          4.35  RUQ(cm)       RLQ(cm)       LUQ(cm)        LLQ(cm)  4.35          0              3.98           3.24 ---------------------------------------------------------------------- Biophysical Evaluation  Amniotic F.V:   Within normal limits       F. Tone:        Observed  F. Movement:    Observed                   Score:          6/8  F. Breathing:   Not Observed ---------------------------------------------------------------------- Gestational Age  LMP:           32w 1d        Date:  04/13/17                 EDD:   01/18/18  Best:          Armida Sans 1d  Det. By:  LMP  (04/13/17)          EDD:   01/18/18 ---------------------------------------------------------------------- Anatomy  Thoracic:              Appears normal         Abdomen:                Appears normal  Diaphragm:             Appears normal         Kidneys:                Appear normal  Stomach:               Appears normal, left   Bladder:                Appears normal                         sided ---------------------------------------------------------------------- Cervix Uterus Adnexa  Cervix  Length:           2.36  cm.  Funneling of internal os noted. ---------------------------------------------------------------------- Impression  Intrauterine pregnancy at 32+1 weeks with nonreactive NST  and decreased fetal movement  Normal amniotic fluid  BPP 6/8, fetal breathing is not noted ---------------------------------------------------------------------- Recommendations  Continue clinical evaluation and management. ----------------------------------------------------------------------                 Charlsie Merles, MD Electronically Signed Final Report   11/25/2017 09:03 am ----------------------------------------------------------------------  [place] Mfm Ob Comp + 14 Wk  Result Date: 11/25/2017 ----------------------------------------------------------------------  OBSTETRICS REPORT                      (Signed Final 11/25/2017 01:16 pm) ---------------------------------------------------------------------- Patient Info  ID #:        [id number]                          D.O.B.:  Mar 30, 1991 (26 yrs)  Name:       Misty [name]                   Visit Date: 11/25/2017 12:19 pm ---------------------------------------------------------------------- Performed By  Performed By:     Tommi Emery         Ref. Address:     [facility]                    RDMS                                                             [address]                                                             [place]  [address]  Attending:        Charlsie Merles MD         Location:         [facility]  Referred By:      Karena Addison CNM ---------------------------------------------------------------------- Orders   #  Description                                 Code   1  [place] MFM OB COMP + 14 WK                      76805.01  ----------------------------------------------------------------------   #  Ordered By               Order #        Accession #    Episode #   1  MEREDITH Idelle Jo          [id number]      [id number]     [id number]  ---------------------------------------------------------------------- Indications   [redacted] weeks gestation of pregnancy                Z3A.32   Preterm labor without delivery, third trimesterO60.03  ---------------------------------------------------------------------- OB History  Blood Type:            Height:  5'5"   Weight (lb):  130       BMI:  21.63  Gravidity:    1 ---------------------------------------------------------------------- Fetal Evaluation  Num Of Fetuses:     1  Preg. Location:     I  Fetal Heart         148  Rate(bpm):  Cardiac Activity:   Observed  Presentation:       Cephalic  Placenta:           Posterior, above cervical os  P. Cord Insertion:  Visualized  Amniotic Fluid  AFI FV:      Subjectively within normal limits  AFI Sum(cm)     %Tile       Largest Pocket(cm)  10.04           17          5.2  RUQ(cm)                     LUQ(cm)         LLQ(cm)  1.29                        5.2            3.55 ---------------------------------------------------------------------- Biometry  BPD:      77.3  mm     G. Age:  31w 0d         11  %    CI:        75.41   %    70 - 86                                                          FL/HC:      22.0   %    19.1 - 21.3  HC:  282.3  mm     G. Age:  31w 0d        < 3  %    HC/AC:      1.01        0.96 - 1.17  AC:      280.8  mm     G. Age:  32w 1d         45  %    FL/BPD:     80.5   %    71 - 87  FL:       62.2  mm     G. Age:  32w 1d         36  %    FL/AC:      22.2   %    20 - 24  HUM:      53.6  mm     G. Age:  31w 1d         31  %  CER:      41.1  mm     G. Age:  35w 1d         84  %  Est. FW:    1868  gm      4 lb 2 oz     50  % ---------------------------------------------------------------------- Gestational Age  LMP:           32w 2d        Date:  04/13/17                 EDD:   01/18/18  U/S Today:     31w 4d                                        EDD:   01/23/18  Best:          32w 2d     Det. By:  LMP  (04/13/17)          EDD:   01/18/18 ---------------------------------------------------------------------- Anatomy  Cranium:               Appears normal         Aortic Arch:            Appears normal  Cavum:                 Appears normal         Ductal Arch:            Appears normal  Ventricles:            Appears normal         Diaphragm:              Appears normal  Choroid Plexus:        Appears normal         Stomach:                Appears normal, left                                                                        sided  Cerebellum:  Appears normal         Abdomen:                Appears normal  Posterior Fossa:       Appears normal         Abdominal Wall:         Not well visualized  Nuchal Fold:           Not applicable (>20    Cord Vessels:           Appears normal ([redacted]                         wks GA)                                        vessel cord)  Face:                   Appears normal         Kidneys:                Appear normal                         (orbits and profile)  Lips:                  Appears normal         Bladder:                Appears normal  Thoracic:              Appears normal         Spine:                  Not well visualized  Heart:                 Appears normal         Upper Extremities:      Visualized                         (4CH, axis, and situs  RVOT:                  Appears normal         Lower Extremities:      Visualized  LVOT:                  Appears normal  Other:  Technically difficult due to maternal habitus and fetal position. ---------------------------------------------------------------------- Cervix Uterus Adnexa  Cervix  Not visualized (advanced GA >29wks)  Uterus  No abnormality visualized.  Left Ovary  Not visualized.  Right Ovary  Not visualized.  Cul De Sac:   No free fluid seen.  Adnexa:       No adnexal mass visualized. ---------------------------------------------------------------------- Impression  Singleton intrauterine pregnancy at 32+2 weeks with preterm  labor here for evaluation  Review of the anatomy shows no sonographic markers for  aneuploidy or structural anomalies  However, views of the fetal spine should be considered  suboptimal secondary to maternal habitus and fetal position  Amniotic fluid volume is normal  Estimated fetal weight shows growth in the 50th percentile ---------------------------------------------------------------------- Recommendations  Continue clinical evaluation and management. ----------------------------------------------------------------------  Charlsie Merles, MD Electronically Signed Final Report   11/25/2017 01:16 pm ----------------------------------------------------------------------    Assessment and Plan: Patient Active Problem List   Diagnosis Date Noted  . Preterm labor in third trimester 11/24/2017  . Palpitations 07/29/2013   1. Admit to [facility] for  threatened preterm labor at 33+1 weeks          - Routine antenatal care         - Procardia 10mg  every 6 hours         - NST q shift; continuous toco          - Neonatology consult appreciated          - Will recheck presentation if she does progress in preterm labor to determine route of delivery         - Oral hydration and regular diet          - Yeast vaginitis: agreeable to Clotrimazole nightly x 7 nights          - Urine culture pending         - Reassess in AM for discharge    Consult for plan of care: Dr. Ileene Patrick, MSN, CNM [facility]   Agree with admission Pt has declined tocolytics Will admit for observation

## 2017-12-01 NOTE — MAU Note (Signed)
Patient states she has been contracting for over 24 hours.  She also reports decreased fetal movement.

## 2017-12-01 NOTE — Progress Notes (Signed)
Speculum exam done by Carlean Jews, CNM.  Vaginal swabs collected.

## 2017-12-02 ENCOUNTER — Encounter (HOSPITAL_COMMUNITY): Payer: Self-pay | Admitting: *Deleted

## 2017-12-02 ENCOUNTER — Observation Stay (HOSPITAL_COMMUNITY): Payer: Federal, State, Local not specified - PPO

## 2017-12-02 ENCOUNTER — Inpatient Hospital Stay (HOSPITAL_COMMUNITY)
Admission: AD | Admit: 2017-12-02 | Discharge: 2017-12-04 | DRG: 806 | Disposition: A | Discharge status: Home / Self Care | Payer: Federal, State, Local not specified - PPO | Source: Ambulatory Visit | Attending: Obstetrics and Gynecology | Admitting: Obstetrics and Gynecology

## 2017-12-02 DIAGNOSIS — O42013 Preterm premature rupture of membranes, onset of labor within 24 hours of rupture, third trimester: Secondary | ICD-10-CM | POA: Diagnosis not present

## 2017-12-02 DIAGNOSIS — D62 Acute posthemorrhagic anemia: Secondary | ICD-10-CM | POA: Diagnosis not present

## 2017-12-02 DIAGNOSIS — O43893 Other placental disorders, third trimester: Secondary | ICD-10-CM | POA: Diagnosis not present

## 2017-12-02 DIAGNOSIS — Z87891 Personal history of nicotine dependence: Secondary | ICD-10-CM | POA: Diagnosis not present

## 2017-12-02 DIAGNOSIS — O9081 Anemia of the puerperium: Secondary | ICD-10-CM | POA: Diagnosis not present

## 2017-12-02 DIAGNOSIS — Z3A33 33 weeks gestation of pregnancy: Secondary | ICD-10-CM

## 2017-12-02 DIAGNOSIS — B373 Candidiasis of vulva and vagina: Secondary | ICD-10-CM | POA: Diagnosis present

## 2017-12-02 DIAGNOSIS — O9882 Other maternal infectious and parasitic diseases complicating childbirth: Secondary | ICD-10-CM | POA: Diagnosis present

## 2017-12-02 DIAGNOSIS — O2690 Pregnancy related conditions, unspecified, unspecified trimester: Secondary | ICD-10-CM | POA: Diagnosis not present

## 2017-12-02 LAB — CBC WITH DIFFERENTIAL/PLATELET
Basophils Absolute: 0 10*3/uL (ref 0.0–0.1)
Basophils Relative: 0 %
EOS ABS: 0 10*3/uL (ref 0.0–0.7)
EOS PCT: 0 %
HCT: 35.7 % — ABNORMAL LOW (ref 36.0–46.0)
Hemoglobin: 12.2 g/dL (ref 12.0–15.0)
LYMPHS PCT: 11 %
Lymphs Abs: 1.9 10*3/uL (ref 0.7–4.0)
MCH: 33.2 pg (ref 26.0–34.0)
MCHC: 34.2 g/dL (ref 30.0–36.0)
MCV: 97 fL (ref 78.0–100.0)
MONO ABS: 0.6 10*3/uL (ref 0.1–1.0)
Monocytes Relative: 3 %
Neutro Abs: 15.3 10*3/uL — ABNORMAL HIGH (ref 1.7–7.7)
Neutrophils Relative %: 86 %
PLATELETS: 264 10*3/uL (ref 150–400)
RBC: 3.68 MIL/uL — ABNORMAL LOW (ref 3.87–5.11)
RDW: 12.9 % (ref 11.5–15.5)
WBC: 17.8 10*3/uL — ABNORMAL HIGH (ref 4.0–10.5)

## 2017-12-02 LAB — CULTURE, OB URINE

## 2017-12-02 MED ORDER — OXYCODONE-ACETAMINOPHEN 5-325 MG PO TABS: 1.0000 | ORAL_TABLET | Freq: Three times a day (TID) | ORAL | 0 refills | Status: DC | PRN

## 2017-12-02 MED ORDER — LACTATED RINGERS IV BOLUS
500.0000 mL | Freq: Once | INTRAVENOUS | Status: AC
  Administered 2017-12-02: 500 mL via INTRAVENOUS

## 2017-12-02 MED ORDER — CLOTRIMAZOLE 1 % VA CREA: 1.0000 | TOPICAL_CREAM | Freq: Every day | VAGINAL | 0 refills | Status: DC

## 2017-12-02 MED ORDER — OXYTOCIN 40 UNITS IN LACTATED RINGERS INFUSION - SIMPLE MED
INTRAVENOUS | Status: AC
  Administered 2017-12-02: 21:00:00
  Filled 2017-12-02: qty 1000

## 2017-12-02 MED ORDER — MISOPROSTOL 200 MCG PO TABS
ORAL_TABLET | ORAL | Status: AC
  Administered 2017-12-02: 800 ug
  Filled 2017-12-02: qty 5

## 2017-12-02 MED ORDER — ACETAMINOPHEN 325 MG PO TABS: 650.0000 mg | ORAL_TABLET | ORAL | 0 refills | Status: DC | PRN

## 2017-12-02 MED ORDER — NIFEDIPINE ER OSMOTIC RELEASE 30 MG PO TB24: 30.0000 mg | ORAL_TABLET | Freq: Every day | ORAL | 0 refills | Status: DC

## 2017-12-02 MED ORDER — LACTATED RINGERS IV SOLN
INTRAVENOUS | Status: DC
  Administered 2017-12-02 (×2): via INTRAVENOUS

## 2017-12-02 MED ORDER — LIDOCAINE HCL (PF) 1 % IJ SOLN
INTRAMUSCULAR | Status: AC
  Administered 2017-12-02: 30 mL
  Filled 2017-12-02: qty 30

## 2017-12-02 NOTE — Progress Notes (Signed)
Meredith, CNM updated on pt having ctx q 2.5-6min. 500cc LR bolus ordered, as well as LR @ 113ml/hr

## 2017-12-02 NOTE — Progress Notes (Signed)
Pt complains of weakness, sweatiness, dizziness, and nausea.  BP 103/64, HR 73, Temp 97.85F Sharyl Nimrod, CNM notified. Will continue to monitor, No new orders at this time.

## 2017-12-02 NOTE — Progress Notes (Signed)
Patient ID: Misty [name], female   DOB: 10-Oct-1990, 27 y.o.   MRN: [id number] 33.2 wks, painful contractions. Not tolerating Procardia  due to low BPs Slow cervical change. S/p BTMZ  X 2 last wk.  Urine cul neg, GBS(-), elevated WBC since last wk, unchanged since then.  Sono- AFI 20, Vx, placenta normal.   BP 98/62 (BP Location: Right Arm)   Pulse 91   Temp 98.3 F (36.8 C)   Resp 18   Ht  (1.651 m)   Wt 126 lb (57.2 kg)   LMP 04/13/2017 (Exact Date)   SpO2 99%   BMI 20.97 kg/m  FHT cat I for 33 wks.  Cx recheck Dr Juliene Pina loose 1 cm/ 90%/ -2/ Vx. Intact. Mucous plug absent  Reviewed findings.  Patient is s/p BTMZ and is past 32 wks so not a candidate for magnesium sulphate. Goal is to prevent preterm delivery but at this point, she can try Procardia XL to stop contractions if tolerated though no data on this dosing or don't take if not tolerated and allow body to declare labor and have preterm birth. She wont be induced for pain unless medical reasons or is active labor.  She was offered few Percocet to help with pain for few days to hope to go past 34 wks and she agrees.   Discharge home. F/up CNM as scheduled on 5/16 in office.  Precautions and warning signs reviewed.   V.Ebin Palazzi, MD

## 2017-12-02 NOTE — Progress Notes (Signed)
ANTEPARTUM NOTE - Hospital Day 1  Subjective: Reports feeling more intense contractions and pressure.  Declined morning dose of Procardia  because she said her BP dropped overnight to 90/50s and she felt weak, clammy, and nauseous.  She states she did not get any relief from taking the Procardia throughout the night, so she feels it is not working. However, she is tearful and unsure of how long she can continue doing this because she is not sleeping and uncomfortable.  Tolerating po intake / no nausea / no vomiting   Voiding QS Denies VB or LOF - states she think she lost her mucus plug.  Also reports she used the Clotrimazole cream vaginally last night for yeast infection.   She endorses good fetal movement  Objective: Vital signs: VS: Blood pressure 117/75, pulse 75, temperature 98.2 F (36.8 C), temperature source Oral, resp. rate 18, height  (1.651 m), weight 57.2 kg (126 lb), last menstrual period 04/13/2017, SpO2 100 %.  Physical exam:        General appearance/behavior: grimacing on right side during contractions; but calm in between       Abdomen: soft, non-tender        Uterus: gravid, non-tender       Extremities: no edema, no evidence of DVT        External genitalia: erythema noted from yeast vaginitis        Speculum examination deferred       Bimanual exam / VE: Dilation: 1 Effacement (%): 80 Cervical Position: Posterior Station: -1 Presentation: Vertex Exam by:: Mereidth Sigmond, CNM  Brown discharge noted on glove   Fetal Assessment:  FHR: NST q shift: Baseline 150 bpm/ moderate variability/ +15x15 accels/ no decels TOCO: continuous monitoring: every 2-3 minutes at times with some uterine irritability; mild to palpation    Assessment: Threatened PTL  33+[redacted] weeks gestation FHR category 1 Leukocytosis of unknown origin    Plan:  1. No new cervical change from yesterday - discussed possible discharge home with rest, hydration, and Procardia  prescription.  Pt. States she'd like to go home, but unsure of how long she can last with the discomfort. Pt. States she will likely not want to take the Procardia due to hypotension.  We also revisited trying Procardia  XL. S/p NICU consult on 5/13 2. OB limited sono for AFI, placenta, and presentation  3. Repeat CBC with diff - however, no evidence of intrauterine infection at this time; Urine culture - 50,0000 colonies of Lactobacillus species, likely contaminant, treatment not indicated   Dr. Juliene Pina and Dr. Billy Coast updated with patient status / plan of care I asked Dr. Juliene Pina to come speak to patient to help facilitate planning   Carlean Jews, [facility]

## 2017-12-02 NOTE — Progress Notes (Signed)
Patient refuses monitoring at this time.  She also requested to be saline locked.

## 2017-12-02 NOTE — Discharge Summary (Signed)
Physician Discharge Summary  Patient ID: Misty [name] MRN: [id number] DOB/AGE: 25/24/1992 24 y.o.  Admit date: 12/01/2017 Discharge date: 12/02/2017  Admission Diagnoses: preterm contractions. S/p BTMZ.   Discharge Diagnoses:  Active Problems:   Preterm labor in third trimester  Discharged Condition: stable  Hospital Course: Patient admitted with contractions. She is declining procardia due to drop in BP and not feeling well. She is declining monitoring due to discomfort. CBC with elevated WBC but stable. Sono - placenta nl, AFI 20 cm. Vx.  Cervix 1 cm at admission and unchanged at 12 noon per CNM, I checked her 4 pm - Cervix 1 cm but effaced and Vertex low.  Reviewed no further intervention if not able to tolerate Procardia. Not a candidate for magnesium since >32 wks. Steroids for FLM done.  So ok to rest at home ad monitor preterm labor symptoms.  Instructions to return to hospital with worsening contractions or ruptured membranes or decreased fetal movements or bleeding.   Disposition:  Home   Discharge Instructions    Diet - low sodium heart healthy   Complete by:  As directed    Increase activity slowly   Complete by:  As directed      Allergies as of 12/02/2017      Reactions   Fish Allergy Swelling      Medication List    TAKE these medications   acetaminophen 325 MG tablet Commonly known as:  TYLENOL Take 2 tablets (650 mg total) by mouth every 4 (four) hours as needed (for pain scale < 4  OR  temperature  >/=  100.5 F).   calcium carbonate 500 MG chewable tablet Commonly known as:  TUMS - dosed in mg elemental calcium Chew 1 tablet by mouth daily as needed for indigestion or heartburn.   clotrimazole 1 % vaginal cream Commonly known as:  GYNE-LOTRIMIN Place 1 Applicatorful vaginally at bedtime.   NIFEdipine 30 MG 24 hr tablet Commonly known as:  PROCARDIA XL Take 1 tablet (30 mg total) by mouth at bedtime for 7 days.   ondansetron 4 MG tablet Commonly  known as:  ZOFRAN Take 1 tablet (4 mg total) by mouth every 8 (eight) hours as needed for nausea or vomiting.   oxyCODONE-acetaminophen 5-325 MG tablet Commonly known as:  PERCOCET/ROXICET Take 1 tablet by mouth every 8 (eight) hours as needed for severe pain.   prenatal multivitamin Tabs tablet Take 1 tablet by mouth 2 (two) times daily.      Follow-up Information    Sigmon, Scarlette Slice, CNM Follow up.   Specialty:  Obstetrics and Gynecology Contact information: [address] [place] [address] [phone]           Signed: Robley Fries 12/02/2017, 4:35 PM

## 2017-12-02 NOTE — H&P (Signed)
OB ADMISSION/ HISTORY & PHYSICAL:  Admission Date: 12/02/2017  8:40 PM  Admit Diagnosis: 33+2 weeks preterm labor with preterm delivery   Misty [name] is a 27 y.o. female G1P0 at 33+2 weeks presenting for active labor contractions.  She was admitted for observation last night to [facility] with preterm labor contractions, s/p BMZ on 5/6 and 5/7.  She initially received Procardia  every 6 hours x 3, then declined Procardia today due to drop in BP and not feeling well.  Her cervical exam was unchanged this afternoon, and she was discharged home with precautions.  She took one dose of prescribed Procardia  XL this evening at home without relief.  She spontaneously ruptured her membranes at home at 1930pm for heavy meconium and called EMS.  Upon arrival to MAU, she was complete/ +4 station.  NICU asked to attend delivery.   Prenatal History: G1P0   EDC : 01/18/2018, Date entered prior to episode creation  Prenatal care: [facility] since 27 weeks; prior uncomplicated prenatal care at [facility]  Primary: D. Renae Fickle, CNM  Prenatal complications:  - Preterm labor at beginning at 32+1 weeks  - Yeast vaginitis   Prenatal labs: ABO/RH: A Positive Antibody: negative Rubella: Immune HIV- NR RPR- NR Hep B - negative 1 hr GTT: 100 GBS: negative GC/Chlam: negative Quad screen negative Declined flu and Tdap  CUS WNL, girl, fundal placenta   Medical / Surgical History :  Past medical history:  Past Medical History:  Diagnosis Date  . Fluttering heart   . Migraines   . SOB (shortness of breath)      Past surgical history: No past surgical history on file.  Family History:  Family History  Problem Relation Age of Onset  . Thyroid disease Mother   . Hypertension Father   . Heart disease Maternal Grandmother   . Hypertension Maternal Grandfather   . CVA Paternal Grandfather      Social History:  reports that she has quit smoking. She has never used smokeless tobacco.  She reports that she drinks alcohol. She reports that she does not use drugs.   Allergies: Fish allergy    Current Medications at time of admission:  Prior to Admission medications   Medication Sig Start Date End Date Taking? Authorizing Provider  acetaminophen (TYLENOL) 325 MG tablet Take 2 tablets (650 mg total) by mouth every 4 (four) hours as needed (for pain scale < 4  OR  temperature  >/=  100.5 F). 12/02/17   Shea Evans, MD  calcium carbonate (TUMS - DOSED IN MG ELEMENTAL CALCIUM) 500 MG chewable tablet Chew 1 tablet by mouth daily as needed for indigestion or heartburn.    [provider]  clotrimazole (GYNE-LOTRIMIN) 1 % vaginal cream Place 1 Applicatorful vaginally at bedtime. 12/02/17   Shea Evans, MD  NIFEdipine (PROCARDIA XL) 30 MG 24 hr tablet Take 1 tablet (30 mg total) by mouth at bedtime for 7 days. 12/02/17 12/09/17  Shea Evans, MD  ondansetron (ZOFRAN) 4 MG tablet Take 1 tablet (4 mg total) by mouth every 8 (eight) hours as needed for nausea or vomiting. Patient not taking: Reported on 11/24/2017 07/23/17   Raeford Razor, MD  oxyCODONE-acetaminophen (PERCOCET/ROXICET) 5-325 MG tablet Take 1 tablet by mouth every 8 (eight) hours as needed for severe pain. 12/02/17   Shea Evans, MD  Prenatal Vit-Fe Fumarate-FA (PRENATAL MULTIVITAMIN) TABS tablet Take 1 tablet by mouth 2 (two) times daily.    [provider]  Review of Systems: Active FM onset of ctx @ 1930 currently every 1-2 minutes LOF  / SROM @ 1930 - heavy meconium  bloody show present    Physical Exam:  VS: Blood pressure 123/78, pulse 95, temperature 97.8 F (36.6 C), resp. rate 20, last menstrual period 04/13/2017, SpO2 100 %.  General: alert and oriented, grimacing and screaming with contractions Heart: RRR Lungs: Clear lung fields Abdomen: Gravid, soft and non-tender, non-distended / uterus: gravid, non-tender; strong uterine contractions  Extremities: no edema  Genitalia /  VE:  10/100/+4/vtx  FHR: baseline rate 135 bpm/ variability moderate / accelerations + 15x15 / occ decelerations to nadir of 90 bpm with contractions TOCO: every 1- 2 minutes/ strong  Assessment: 33+[redacted] weeks gestation Preterm labor  Active stage of labor FHR category 2 GBS Negative   Plan:  1. Admit to Postpartum     - Routine postpartum orders     - Routine admission labs     - Postpartum Pitocin IV     - Placenta to pathology  2. GBS Negative 3. Postpartum:   - Breast/formula 4. S/p Precipitous delivery in MAU   - NICU notified and in attendance - see notes for details   Dr. Juliene Pina notified of admission / plan of care  Carlean Jews, MSN, CNM [facility]

## 2017-12-02 NOTE — Discharge Instructions (Signed)

## 2017-12-03 ENCOUNTER — Other Ambulatory Visit: Payer: Self-pay

## 2017-12-03 ENCOUNTER — Encounter (HOSPITAL_COMMUNITY): Payer: Self-pay

## 2017-12-03 LAB — CBC WITH DIFFERENTIAL/PLATELET
BASOS ABS: 0 10*3/uL (ref 0.0–0.1)
BASOS PCT: 0 %
Eosinophils Absolute: 0.1 10*3/uL (ref 0.0–0.7)
Eosinophils Relative: 0 %
HEMATOCRIT: 31.1 % — AB (ref 36.0–46.0)
HEMOGLOBIN: 10.7 g/dL — AB (ref 12.0–15.0)
LYMPHS PCT: 11 %
Lymphs Abs: 2 10*3/uL (ref 0.7–4.0)
MCH: 33.3 pg (ref 26.0–34.0)
MCHC: 34.4 g/dL (ref 30.0–36.0)
MCV: 96.9 fL (ref 78.0–100.0)
MONOS PCT: 8 %
Monocytes Absolute: 1.4 10*3/uL — ABNORMAL HIGH (ref 0.1–1.0)
NEUTROS ABS: 14.2 10*3/uL — AB (ref 1.7–7.7)
NEUTROS PCT: 81 %
Platelets: 251 10*3/uL (ref 150–400)
RBC: 3.21 MIL/uL — ABNORMAL LOW (ref 3.87–5.11)
RDW: 12.6 % (ref 11.5–15.5)
WBC: 17.7 10*3/uL — ABNORMAL HIGH (ref 4.0–10.5)

## 2017-12-03 LAB — RPR: RPR Ser Ql: NONREACTIVE

## 2017-12-03 LAB — TYPE AND SCREEN
ABO/RH(D): A POS
Antibody Screen: NEGATIVE

## 2017-12-03 MED ORDER — SENNOSIDES-DOCUSATE SODIUM 8.6-50 MG PO TABS
2.0000 | ORAL_TABLET | ORAL | Status: DC
  Administered 2017-12-03: 2 via ORAL
  Filled 2017-12-03 (×2): qty 2

## 2017-12-03 MED ORDER — IBUPROFEN 600 MG PO TABS
600.0000 mg | ORAL_TABLET | Freq: Four times a day (QID) | ORAL | Status: DC
  Administered 2017-12-03 – 2017-12-04 (×2): 600 mg via ORAL
  Filled 2017-12-03 (×4): qty 1

## 2017-12-03 MED ORDER — SIMETHICONE 80 MG PO CHEW: 80.0000 mg | CHEWABLE_TABLET | ORAL | Status: DC | PRN

## 2017-12-03 MED ORDER — ACETAMINOPHEN 325 MG PO TABS: 650.0000 mg | ORAL_TABLET | ORAL | Status: DC | PRN

## 2017-12-03 MED ORDER — ZOLPIDEM TARTRATE 5 MG PO TABS: 5.0000 mg | ORAL_TABLET | Freq: Every evening | ORAL | Status: DC | PRN

## 2017-12-03 MED ORDER — ONDANSETRON HCL 4 MG/2ML IJ SOLN: 4.0000 mg | INTRAMUSCULAR | Status: DC | PRN

## 2017-12-03 MED ORDER — ONDANSETRON HCL 4 MG PO TABS: 4.0000 mg | ORAL_TABLET | ORAL | Status: DC | PRN

## 2017-12-03 MED ORDER — BENZOCAINE-MENTHOL 20-0.5 % EX AERO
1.0000 | INHALATION_SPRAY | CUTANEOUS | Status: DC | PRN
Start: 2017-12-03 — End: 2017-12-04
  Administered 2017-12-03: 1 via TOPICAL
  Filled 2017-12-03: qty 56

## 2017-12-03 MED ORDER — COCONUT OIL OIL
1.0000 "application " | TOPICAL_OIL | Status: DC | PRN
  Administered 2017-12-03: 1 via TOPICAL
  Filled 2017-12-03: qty 120

## 2017-12-03 MED ORDER — MAGNESIUM OXIDE 400 (241.3 MG) MG PO TABS
400.0000 mg | ORAL_TABLET | Freq: Every day | ORAL | Status: DC
  Administered 2017-12-03: 400 mg via ORAL
  Filled 2017-12-03 (×3): qty 1

## 2017-12-03 MED ORDER — POLYSACCHARIDE IRON COMPLEX 150 MG PO CAPS
150.0000 mg | ORAL_CAPSULE | Freq: Every day | ORAL | Status: DC
  Administered 2017-12-03: 150 mg via ORAL
  Filled 2017-12-03 (×3): qty 1

## 2017-12-03 MED ORDER — DIPHENHYDRAMINE HCL 25 MG PO CAPS: 25.0000 mg | ORAL_CAPSULE | Freq: Four times a day (QID) | ORAL | Status: DC | PRN

## 2017-12-03 NOTE — Progress Notes (Signed)
PPD 1 SVD - PTB  0845am: Rounds - not in room / visiting baby in NICU.                - will return after lunch for rounding visit.  1440pm:  S:  Reports feeling well             Tolerating po/ No nausea or vomiting             Bleeding is light             Pain controlled with Motrin             Up ad lib / ambulatory / voiding QS  Newborn stable in NICU due to PTB/ Breast and donor milk  O:   VS: BP 107/71 (BP Location: Right Arm)   Pulse (!) 110   Temp 98.6 F (37 C) (Oral)   Resp 18   Ht  (1.651 m)   Wt 57.2 kg (126 lb)   LMP 04/13/2017 (Exact Date)   SpO2 98%   BMI 20.97 kg/m    LABS:              Recent Labs    12/02/17 1546 12/03/17 0608  WBC 17.8* 17.7*  HGB 12.2 10.7*  PLT 264 251               Physical Exam:             Alert and oriented X3             Abdomen: soft, non-tender, non-distended              Fundus: firm, non-tender, U-3  Perineum: no edema  Lochia: light  Extremities: no edema, no calf pain or tenderness    A: PPD # 1 PTB at 33 weeks   Doing well - stable status  P: Routine post partum orders   [name] CNM, MSN, [facility] 12/03/2017, 2:56 PM

## 2017-12-03 NOTE — Lactation Note (Addendum)
This note was copied from a baby's chart. Lactation Consultation Note  Patient Name: [name] ZOXWR'U Date: 12/03/2017 Reason for consult: Initial assessment;NICU baby;Preterm <34wks;Infant < 6lbs;1st time breastfeeding;Primapara   Initial consult with first time mom of preterm infant in the NICU. Mom reports she has been pumping every 3 hours and getting some gtts of colostrum. Mom reports she has been leaking for several weeks. Infant has nuzzled at the breast.   Enc mom to pump 8-12 x in 24 hours with DEBP on Initiate setting and follow with hand expression. Mom was shown how to hand express and colostrum easily expressible.. Mom with soft compressible breasts and short shaft everted nipples.   Providing Milk for Your Baby in NICU Booklet given, reviewed pumping, what to expect with pumping, milk coming to volume, hand expression and what to expect with preterm infant and BF.   Mom has containers and labels to label milk. Mom given yellow stickers to label colostrum containers.   Mom has Ameda pump at home for use. Mom enc to take pump tubing home and use NICU pumping rooms when visiting infant in the NICU after d/c.   BF Resources handout and [facility] Brochure given, mom informed of IP/OP services, BF Support Groups and LC phone #.   Mom reports she has no questions/concerns at this time. Mom to call for assistance as needed.    Maternal Data Formula Feeding for Exclusion: No Has patient been taught Hand Expression?: Yes Does the patient have breastfeeding experience prior to this delivery?: No  Feeding    LATCH Score                   Interventions Interventions: Hand express  Lactation Tools Discussed/Used WIC Program: No Pump Review: Setup, frequency, and cleaning;Milk Storage Initiated by:: Reviewed and enouraged 8-12 x a day and follow with hand expression   Consult Status Consult Status: Follow-up Date: 12/04/17 Follow-up type:  In-patient    Silas Flood Robert Sunga 12/03/2017, 12:12 PM

## 2017-12-04 ENCOUNTER — Encounter (HOSPITAL_COMMUNITY): Payer: Self-pay

## 2017-12-04 MED ORDER — IBUPROFEN 600 MG PO TABS: 600.0000 mg | ORAL_TABLET | Freq: Four times a day (QID) | ORAL | 0 refills | Status: DC

## 2017-12-04 MED ORDER — POLYSACCHARIDE IRON COMPLEX 150 MG PO CAPS: 150.0000 mg | ORAL_CAPSULE | Freq: Every day | ORAL | 0 refills | Status: DC

## 2017-12-04 MED ORDER — MAGNESIUM OXIDE 400 (241.3 MG) MG PO TABS: 400.0000 mg | ORAL_TABLET | Freq: Every day | ORAL | 0 refills | Status: DC

## 2017-12-04 NOTE — Discharge Summary (Signed)
Obstetric Discharge Summary Reason for Admission: onset of labor after several days of threatened PTL Prenatal Procedures: NST and BMZ course and tocolytic therapy Intrapartum Procedures: spontaneous vaginal delivery after rapid labor progression in less than 3 hours Postpartum Procedures: none Complications-Operative and Postpartum: vaginal laceration Hemoglobin  Date Value Ref Range Status  12/03/2017 10.7 (L) 12.0 - 15.0 g/dL Final   HCT  Date Value Ref Range Status  12/03/2017 31.1 (L) 36.0 - 46.0 % Final    Physical Exam:  General: alert, cooperative and no distress Lochia: appropriate Uterine Fundus: firm Incision: healing well DVT Evaluation: No evidence of DVT seen on physical exam.  Discharge Diagnoses: Premature labor and SVB at 33 weeks and ABL anemia  Discharge Information: Date: 12/04/2017 Activity: pelvic rest Diet: routine Medications: PNV, Ibuprofen, Iron and magnesium Condition: stable Instructions: refer to practice specific booklet Discharge to: home Follow-up Information    Karena Addison, CNM. Schedule an appointment as soon as possible for a visit in 2 week(s).   Specialty:  Obstetrics and Gynecology Why:  breastfeeding / recheck - NICU baby Contact information: [address] [place] [address] [phone]           Newborn Data: Live born female  Birth Weight: 4 lb 4.8 oz (1950 g) APGAR: 8, 8  Admission to NICU for prematurity   Newborn Delivery   Birth date/time:  12/02/2017 20:53:00 Delivery type:  Vaginal, Spontaneous       [name] 12/04/2017, 3:07 PM

## 2017-12-04 NOTE — Lactation Note (Signed)
This note was copied from a baby's chart. Lactation Consultation Note; Follow up visit with this mom of NICU baby. Reports pumping is going well. Pumped about 8 times yesterday and is obtaining about 5 ml of Colostrum. Probable DC today. Has Ameda pump for home. Baby is nuzzling at the breast. No questions at present. Reviewed our phone number to call with questions/concerns  Patient Name: Misty [name] [id number] Date: 12/04/2017 Reason for consult: Follow-up assessment;Preterm <34wks;NICU baby;Infant < 6lbs   Maternal Data Has patient been taught Hand Expression?: Yes Does the patient have breastfeeding experience prior to this delivery?: No  Feeding Feeding Type: Donor Breast Milk Length of feed: 30 min  LATCH Score                   Interventions    Lactation Tools Discussed/Used WIC Program: No   Consult Status Consult Status: Complete    Pamelia Hoit 12/04/2017, 8:50 AM

## 2017-12-04 NOTE — Progress Notes (Signed)
PPD 2 SVD  S:  Reports feeling ok - weak and tired / tearful about going home             Tolerating po/ No nausea or vomiting             Bleeding is spotting             Pain controlled withibuprofen (OTC)             Up ad lib / ambulatory / voiding QS  Newborn stable in NICU/ Breast / pumping well for baby feedings O:               VS: BP 96/65 (BP Location: Left Arm)   Pulse 89   Temp 98.4 F (36.9 C) (Oral)   Resp 16   Ht  (1.651 m)   Wt 57.2 kg (126 lb)   LMP 04/13/2017 (Exact Date)   SpO2 100%   BMI 20.97 kg/m               Physical Exam:             Alert and oriented X3  Abdomen: soft, non-tender, non-distended              Fundus: firm, non-tender, U-3  Perineum: no edema  Lochia: light spotting  Extremities: no edema, no calf pain or tenderness   A: PPD # 2 PTB / newborn stable in NICU  Doing well - stable status  P: Routine post partum orders  DC home - WOB booklet - instructions reviewed  [name] CNM, MSN, [facility] 12/04/2017, 12:20 PM

## 2017-12-04 NOTE — Progress Notes (Signed)
Patient discharged home with her mother. Discharge teaching, home care, prescriptions, follow-up appts, and reasons to seek care discussed. Pt verbalized understanding.  

## 2017-12-04 NOTE — Progress Notes (Signed)
Pt Sleeping

## 2017-12-08 ENCOUNTER — Telehealth: Payer: Self-pay | Admitting: Interventional Cardiology

## 2017-12-08 NOTE — Telephone Encounter (Signed)
Returned call to patient who states that she has had some intermittent episodes of SOB and left sided chest discomfort under her breast that she rates 2/10. Patient denies radiation, cough, leg pain, n/v, syncope, or any other symptoms. She states that she feels this way when she stands or moves a certain way. Patient states that this has been going on for the past 10 days since she went into preterm labor. Patient states that her BP has been around 122/78 HR 78 and she feels dizzy at times. Patient denies any symptoms at this time. Patient has a follow up appointment with her OB-GYN next week. Instructed for patient to keep her appointment with her OB-GYN next week and let us know if her symptoms change or worsen. Patient verbalized understanding and thanked me for the call.

## 2017-12-08 NOTE — Telephone Encounter (Signed)
New Message:       Pt c/o Shortness Of Breath: STAT if SOB developed within the last 24 hours or pt is noticeably SOB on the phone  1. Are you currently SOB (can you hear that pt is SOB on the phone)? No  2. How long have you been experiencing SOB? For 10 days  3. Are you SOB when sitting or when up moving around? Moving around/standing up  4. Are you currently experiencing any other symptoms? Chest discomfort but not pain and low pulse and higher BP than usual.    Pt c/o BP issue: STAT if pt c/o blurred vision, one-sided weakness or slurred speech  1. What are your last 5 BP readings? 122/78 but normal 100/65 HR feels slower per pt  2. Are you having any other symptoms (ex. Dizziness, headache, blurred vision, passed out)? Dizziness and headaches  3. What is your BP issue? Pt states her BP in higher than usual for her.    Pt is wanting to know if she can be seen today by dr or a pa

## 2017-12-16 ENCOUNTER — Ambulatory Visit: Payer: Self-pay

## 2017-12-16 NOTE — Lactation Note (Signed)
This note was copied from a baby's chart. Lactation Consultation Note  Patient Name: Misty [name] [id number] Date: 12/16/2017   Visited with Mom in NICU.  Questions regarding her breasts, and tenderness on nipples.  Both nipples fairly pink, breasts full, not engorged.  Mom has a large milk supply, pumping 8 oz per pumping session.  No sign of nipple bleb, or crack, blistering or bruising. Mom denies nipple burning sensation.  Encouraged coconut oil before pumping, making sure her flange size is large enough so not rubbing.  Mom denies this.  Offered shells for soreness, but Mom states she is ok. No signs or symptoms of mastitis or plugged ducts.  Mom to continue pumping frequently, using warm, moist compresses on both breasts and massage prior to pumping.    To call prn for concerns  [name] 12/16/2017, 12:55 PM

## 2017-12-18 DIAGNOSIS — O9081 Anemia of the puerperium: Secondary | ICD-10-CM | POA: Diagnosis not present

## 2017-12-19 ENCOUNTER — Ambulatory Visit: Payer: Self-pay

## 2017-12-19 NOTE — Lactation Note (Signed)
This note was copied from a baby's chart. Lactation Consultation Note  Patient Name: Misty [name] [id number] Date: 12/19/2017   RN called from NICU requesting NS # 20 and 24. LC took supplies to NICU, mom had baby at the breast when entering the pod. Per RN baby was not really "engaged" at this point to try the NS; mom requested lactation to come at another time to observe the feeding with a NS. RN will get mom sized with a NS prior lactation consultation tomorrow. LC to come back tomorrow between 2-3 pm, NICU RN will call lactation when baby is ready to feed.  Maternal Data    Feeding Feeding Type: Breast Milk    Interventions    Lactation Tools Discussed/Used     Consult Status      Mirta Mally S Shailee Foots 12/19/2017, 2:22 PM

## 2017-12-20 ENCOUNTER — Ambulatory Visit: Payer: Self-pay

## 2017-12-20 NOTE — Lactation Note (Signed)
This note was copied from a Misty [name]'s chart. Lactation Consultation Note  Patient Name: Misty [name] [id number]'WToday's Date: 12/20/2017 Reason for consult: Follow-up assessment;NICU Misty [name] Pecola LeisureBaby is 182 weeks old and 35.6 CGA.  Mom is pumping 8-12 times per day and has an abundant milk supply.  She pumps 90-150 mls per breast.  Misty [name] was just bathed and awake and putting hands to mouth.  Mom positioned Misty [name] in cross cradle hold with a 20 mm nipple shield.  Misty [name] latched easily and good active suck/swallows with self pacing observed.  Suck/swallow ratio 1:1.  Misty [name] took breaks every 2-3 minutes and then started back into a rhythmic pattern.  IDFS scale 1>15 minutes so no gavage given.  Maternal Data    Feeding Feeding Type: Breast Fed Length of feed: 20 min  LATCH Score Latch: Repeated attempts needed to sustain latch, nipple held in mouth throughout feeding, stimulation needed to elicit sucking reflex.  Audible Swallowing: Spontaneous and intermittent  Type of Nipple: Flat  Comfort (Breast/Nipple): Soft / non-tender  Hold (Positioning): Assistance needed to correctly position infant at breast and maintain latch.  LATCH Score: 7  Interventions Interventions: Breast feeding basics reviewed;Assisted with latch;Skin to skin;Breast compression;Adjust position;Support pillows;Position options  Lactation Tools Discussed/Used Tools: Nipple Shields Nipple shield size: 20   Consult Status Consult Status: PRN    Huston FoleyMOULDEN, Weda Baumgarner S 12/20/2017, 3:18 PM

## 2018-01-06 ENCOUNTER — Encounter: Payer: Self-pay | Admitting: Physician Assistant

## 2018-01-06 ENCOUNTER — Ambulatory Visit: Payer: Federal, State, Local not specified - PPO | Admitting: Physician Assistant

## 2018-01-06 VITALS — BP 110/60 | HR 93 | Ht 65.0 in | Wt 111.0 lb

## 2018-01-06 DIAGNOSIS — R0602 Shortness of breath: Secondary | ICD-10-CM

## 2018-01-06 DIAGNOSIS — R42 Dizziness and giddiness: Secondary | ICD-10-CM

## 2018-01-06 DIAGNOSIS — I351 Nonrheumatic aortic (valve) insufficiency: Secondary | ICD-10-CM

## 2018-01-06 NOTE — Patient Instructions (Signed)
Medication Instructions:  No changes  Labwork: None today  Testing/Procedures: 1. Echocardiogram 2. 48 Hour Holter Monitor  Follow-Up: Lance MussJayadeep Varanasi, MD in 3 months.   Any Other Special Instructions Will Be Listed Below (If Applicable).  If you need a refill on your cardiac medications before your next appointment, please call your pharmacy.

## 2018-01-06 NOTE — Progress Notes (Signed)
Cardiology Office Note:    Date:  01/06/2018   ID:  Misty [name], DOB 1991-05-18, MRN [id number]  PCP:  [name], MD  Cardiologist:  Lance Muss, MD   Referring MD: [name], MD   Chief Complaint  Patient presents with  . Shortness of Breath  . Dizziness    History of Present Illness:    Misty [name] is a 27 y.o. female with mild aortic insufficiency dx at age 69, tachycardia with a neg workup in the past.  GXT in 2014 was normal.  Echo in 2014 demonstrated normal EF and only trace AI.  Echo in [place] in 06/2016 demonstrated trace AI and normal EF.  Last seen by Dr. Everette Rank in 05/2017.    [name] returns for evaluation of palpitations and shortness of breath.  She is 5 weeks post partum.  Her daughter was 7 weeks premature.  Since delivery, she has felt that her HR is slow at times and she feels short of breath with activity.  She feels short of breath sometimes with just standing for prolonged periods.  She denies paroxysmal nocturnal dyspnea, leg swelling.  She feels tight in her chest when she gets short of breath.  She feels fatigued.  She notes lightheadedness at times but no syncope.  She is currently nursing.    Prior CV studies:   The following studies were reviewed today:  Holter 08/01/16 NSR, HR 51-152  [place] 07/11/16 ([place] EF 60-65, no RWMA, normal diastolic function, trace/mild AI, trace TR  GXT 5/14 No ischemic changes  Echo 5/14 EF 60-65, trace MR/AI,   Past Medical History:  Diagnosis Date  . Aortic insufficiency    Echo 5/14: EF 60-65, trace MR/AI // Echo 12/17 (in [place]):  EF 60-65, no RWMA, normal diastolic function, trace/mild AI, trace TR  . History of exercise stress test    GXT 5/14:  No ischemic changes  . Migraines   . Palpitations    Holter 1/18:  NSR, HR 51-152  . SOB (shortness of breath)    Surgical Hx: The patient  has no past surgical history on file.   Current Medications: Current Meds  Medication Sig  .  Prenatal Vit-Fe Fumarate-FA (PRENATAL MULTIVITAMIN) TABS tablet Take 1 tablet by mouth 2 (two) times daily.     Allergies:   Fish allergy   Social History   Tobacco Use  . Smoking status: Former Games developer  . Smokeless tobacco: Never Used  Substance Use Topics  . Alcohol use: Yes    Alcohol/week: 0.0 oz    Comment: occasional  . Drug use: No     Family Hx: The patient's family history includes CVA in her paternal grandfather; Heart disease in her maternal grandmother; Hypertension in her father and maternal grandfather; Thyroid disease in her mother.  ROS:   Please see the history of present illness.    ROS All other systems reviewed and are negative.   EKGs/Labs/Other Test Reviewed:    EKG:  EKG is  ordered today.  The ekg ordered today demonstrates normal sinus rhythm, heart rate 93, normal axis, nonspecific ST-T wave changes, QTC 427, similar to prior tracing  Recent Labs: 12/03/2017: Hemoglobin 10.7; Platelets 251   Recent Lipid Panel No results found for: CHOL, TRIG, HDL, CHOLHDL, LDLCALC, LDLDIRECT  Physical Exam:    VS:  BP 110/60 (BP Location: Left Arm, Patient Position: Sitting, Cuff Size: Normal)   Pulse 93   Ht 5\' 5"  (1.651 m)   Wt 111  lb (50.3 kg)   LMP 04/13/2017 (Exact Date)   BMI 18.47 kg/m     Wt Readings from Last 3 Encounters:  01/06/18 111 lb (50.3 kg)  12/02/17 126 lb (57.2 kg)  12/01/17 126 lb (57.2 kg)     Physical Exam  Constitutional: She is oriented to person, place, and time. She appears well-developed and well-nourished. No distress.  HENT:  Head: Normocephalic and atraumatic.  Neck: Neck supple. No JVD present.  Cardiovascular: Normal rate, regular rhythm, S1 normal, S2 normal and normal heart sounds.  No murmur heard. Pulmonary/Chest: Effort normal. She has no rales.  Abdominal: Soft. There is no hepatomegaly.  Musculoskeletal: She exhibits no edema.  Neurological: She is alert and oriented to person, place, and time.  Skin: Skin  is warm and dry.    ASSESSMENT & PLAN:    Shortness of breath  She has noted episodes of shortness of breath with activity as well as at rest since delivery of her child.  She does not appear to be volume overloaded on exam.  She does have a history of aortic insufficiency.  However, the murmur is not evident on exam.  I doubt that she developed a peripartum cardiomyopathy.  However, I will obtain an echocardiogram to rule this out.  -Obtain echocardiogram  Dizziness  She has noted significant heart rate variability since the delivery of her child.  She has recorded heart rates in the 50s at times.  She has also had lower blood pressure.  She feels her heart rate variability associated with her shortness of breath.  Obtain an echocardiogram as noted above.  I will also obtain a 48-hour Holter monitor to rule out arrhythmia.  -Schedule 48-hour Holter  Aortic valve insufficiency, etiology of cardiac valve disease unspecified Trace or mild by previous echocardiograms.  As noted, we will obtain a repeat echocardiogram.   Dispo:  Return in about 3 months (around 04/08/2018) for Routine Follow Up.   Medication Adjustments/Labs and Tests Ordered: Current medicines are reviewed at length with the patient today.  Concerns regarding medicines are outlined above.  Tests Ordered: Orders Placed This Encounter  Procedures  . Holter monitor - 48 hour  . EKG 12-Lead  . ECHOCARDIOGRAM COMPLETE   Medication Changes: No orders of the defined types were placed in this encounter.   Signed, Tereso NewcomerScott Saraiah Bhat, PA-C  01/06/2018 4:45 PM    [facility] [address], [place], [place]  [address] Phone: [phone]; Fax: [phone]

## 2018-01-15 ENCOUNTER — Ambulatory Visit (HOSPITAL_COMMUNITY): Payer: Federal, State, Local not specified - PPO | Attending: Internal Medicine

## 2018-01-15 ENCOUNTER — Ambulatory Visit (INDEPENDENT_AMBULATORY_CARE_PROVIDER_SITE_OTHER): Payer: Federal, State, Local not specified - PPO

## 2018-01-15 ENCOUNTER — Other Ambulatory Visit: Payer: Self-pay

## 2018-01-15 DIAGNOSIS — I351 Nonrheumatic aortic (valve) insufficiency: Secondary | ICD-10-CM | POA: Diagnosis not present

## 2018-01-15 DIAGNOSIS — R002 Palpitations: Secondary | ICD-10-CM | POA: Insufficient documentation

## 2018-01-15 DIAGNOSIS — R0602 Shortness of breath: Secondary | ICD-10-CM | POA: Diagnosis not present

## 2018-01-15 DIAGNOSIS — R42 Dizziness and giddiness: Secondary | ICD-10-CM | POA: Insufficient documentation

## 2018-01-15 DIAGNOSIS — R000 Tachycardia, unspecified: Secondary | ICD-10-CM | POA: Diagnosis not present

## 2018-01-16 ENCOUNTER — Encounter: Payer: Self-pay | Admitting: Physician Assistant

## 2018-01-16 DIAGNOSIS — Z1329 Encounter for screening for other suspected endocrine disorder: Secondary | ICD-10-CM | POA: Diagnosis not present

## 2018-04-03 DIAGNOSIS — N926 Irregular menstruation, unspecified: Secondary | ICD-10-CM | POA: Diagnosis not present

## 2018-04-12 ENCOUNTER — Encounter (HOSPITAL_COMMUNITY): Payer: Self-pay

## 2018-04-21 DIAGNOSIS — G43109 Migraine with aura, not intractable, without status migrainosus: Secondary | ICD-10-CM | POA: Diagnosis not present

## 2018-04-30 ENCOUNTER — Ambulatory Visit: Payer: Federal, State, Local not specified - PPO | Admitting: Interventional Cardiology

## 2018-04-30 ENCOUNTER — Encounter: Payer: Self-pay | Admitting: Interventional Cardiology

## 2018-04-30 VITALS — BP 110/58 | HR 97 | Ht 65.0 in | Wt 113.0 lb

## 2018-04-30 DIAGNOSIS — I351 Nonrheumatic aortic (valve) insufficiency: Secondary | ICD-10-CM | POA: Diagnosis not present

## 2018-04-30 DIAGNOSIS — R002 Palpitations: Secondary | ICD-10-CM | POA: Diagnosis not present

## 2018-04-30 DIAGNOSIS — R0602 Shortness of breath: Secondary | ICD-10-CM | POA: Diagnosis not present

## 2018-04-30 NOTE — Patient Instructions (Signed)
Medication Instructions:  Your physician recommends that you continue on your current medications as directed. Please refer to the Current Medication list given to you today.  If you need a refill on your cardiac medications before your next appointment, please call your pharmacy.   Lab work: None Ordered  If you have labs (blood work) drawn today and your tests are completely normal, you will receive your results only by: . MyChart Message (if you have MyChart) OR . A paper copy in the mail If you have any lab test that is abnormal or we need to change your treatment, we will call you to review the results.  Testing/Procedures: None ordered  Follow-Up: Follow up AS NEEDED  Any Other Special Instructions Will Be Listed Below (If Applicable).    

## 2018-04-30 NOTE — Progress Notes (Signed)
Cardiology Office Note   Date:  04/30/2018   ID:  Misty [name], Misty [name] 05/05/1991, MRN [id number]  PCP:  Misty Rua, MD    No chief complaint on file.  Dizziness  Wt Readings from Last 3 Encounters:  04/30/18 113 lb (51.3 kg)  01/06/18 111 lb (50.3 kg)  12/02/17 126 lb (57.2 kg)       History of Present Illness: Misty [name] is a 27 y.o. female   with mildaortic insufficiency dx at age 56, tachycardia with a neg workup in the past. GXT in 2014 was normal. Echo in 2014 demonstrated normal EF and only trace AI.  Echo in [place] in 06/2016 demonstrated trace AI and normal EF.   She had a baby in 4/19.  She had some shortness of breath post partum. Echo was repeated and showed normal LV function.  Normal Right side as well. .  Monitor in June 2019 showed:   NSR, sinus tachycardia with rare PACs and PVCs. No pathologic arrythmias.  She has sometimes felt dizzy with standing.  THis has improved.    Denies : Chest pain. Dizziness. Leg edema. Nitroglycerin use. Orthopnea. Palpitations. Paroxysmal nocturnal dyspnea. Shortness of breath. Syncope.   She has had migraines more commonly. She has had nausea and vomiting with these.    Past Medical History:  Diagnosis Date  . Aortic insufficiency    Echo 5/14: EF 60-65, trace MR/AI // Echo 12/17 (in [place]):  EF 60-65, no RWMA, normal diastolic function, trace/mild AI, trace TR // Echo 6/19: EF 55-60, normal diastolic function, trivial AI   . History of exercise stress test    GXT 5/14:  No ischemic changes  . Migraines   . Palpitations    Holter 1/18:  NSR, HR 51-152  . SOB (shortness of breath)     History reviewed. No pertinent surgical history.   No current outpatient medications on file.   No current facility-administered medications for this visit.     Allergies:   Fish allergy    Social History:  The patient  reports that she has quit smoking. She has never used smokeless tobacco. She reports that she drinks  alcohol. She reports that she does not use drugs.   Family History:  The patient's family history includes CVA in her paternal grandfather; Heart disease in her maternal grandmother; Hypertension in her father and maternal grandfather; Thyroid disease in her mother.    ROS:  Please see the history of present illness.   Otherwise, review of systems are positive for sleep deprivation due to her new baby.   All other systems are reviewed and negative.    PHYSICAL EXAM: VS:  BP (!) 110/58   Pulse 97   Ht 5\' 5"  (1.651 m)   Wt 113 lb (51.3 kg)   SpO2 97%   BMI 18.80 kg/m  , BMI Body mass index is 18.8 kg/m. GEN: Well nourished, well developed, in no acute distress  HEENT: normal  Neck: no JVD, carotid bruits, or masses Cardiac: RRR; no murmurs, rubs, or gallops,no edema ; mild chest pain to the left sided ribs to palpation Respiratory:  clear to auscultation bilaterally, normal work of breathing GI: soft, nontender, nondistended, + BS MS: no deformity or atrophy  Skin: warm and dry, no rash Neuro:  Strength and sensation are intact Psych: euthymic mood, full affect    Recent Labs: 12/03/2017: Hemoglobin 10.7; Platelets 251   Lipid Panel No results found for: CHOL, TRIG, HDL, CHOLHDL, VLDL,  LDLCALC, LDLDIRECT   Other studies Reviewed: Additional studies/ records that were reviewed today with results demonstrating: prior echo and monitor reviewed.   ASSESSMENT AND PLAN:  1. AI: No CHF.  Trivial. No need for SBE.  2. Shortness of breath: Improving.  COntinue to try to exercise regularly and get better sleep.  No evidence of cardiac source. 3. Palpitations: Minimal.  Worse when she has not slept.  No evidence of any pathologic arrhtyhmia.   Current medicines are reviewed at length with the patient today.  The patient concerns regarding her medicines were addressed.  The following changes have been made:  No change  Labs/ tests ordered today include:  No orders of the defined  types were placed in this encounter.   Recommend 150 minutes/week of aerobic exercise Low fat, low carb, high fiber diet recommended  Disposition:   FU prn   Signed, Lance Muss, MD  04/30/2018 3:28 PM    [facility] [address] [place], [place], [place]  [address] Phone: [phone]; Fax: [phone]

## 2018-06-12 DIAGNOSIS — J029 Acute pharyngitis, unspecified: Secondary | ICD-10-CM | POA: Diagnosis not present

## 2018-06-26 DIAGNOSIS — Z3202 Encounter for pregnancy test, result negative: Secondary | ICD-10-CM | POA: Diagnosis not present

## 2018-07-24 DIAGNOSIS — Z3202 Encounter for pregnancy test, result negative: Secondary | ICD-10-CM | POA: Diagnosis not present

## 2018-07-24 DIAGNOSIS — Z113 Encounter for screening for infections with a predominantly sexual mode of transmission: Secondary | ICD-10-CM | POA: Diagnosis not present

## 2018-07-24 DIAGNOSIS — Z114 Encounter for screening for human immunodeficiency virus [HIV]: Secondary | ICD-10-CM | POA: Diagnosis not present

## 2018-07-27 DIAGNOSIS — Z3202 Encounter for pregnancy test, result negative: Secondary | ICD-10-CM | POA: Diagnosis not present

## 2018-08-31 DIAGNOSIS — N61 Mastitis without abscess: Secondary | ICD-10-CM | POA: Diagnosis not present

## 2018-09-02 IMAGING — US US MFM OB LIMITED
1 series · 15 of 25 positions shown · non-contrast
Comparison: none

[Series 1: us mfm ob limited · 25 acquisitions, 15 frames shown]
[im 1/25]
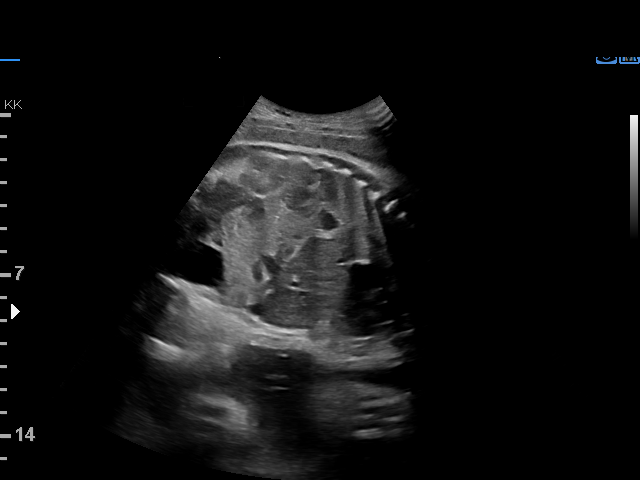
[im 3/25]
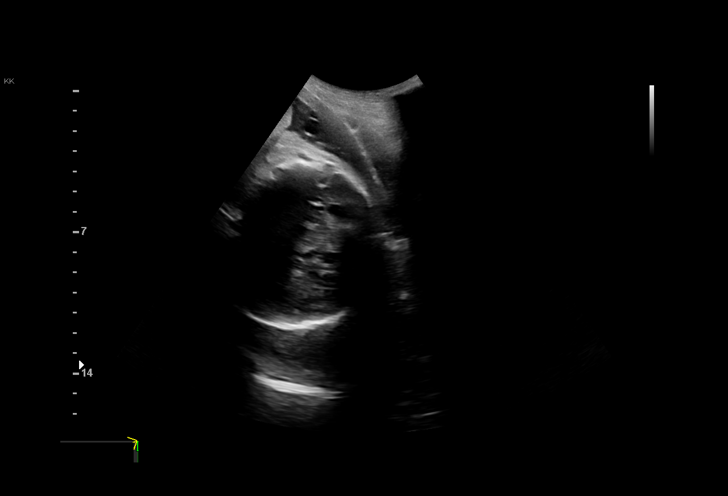
[im 5/25]
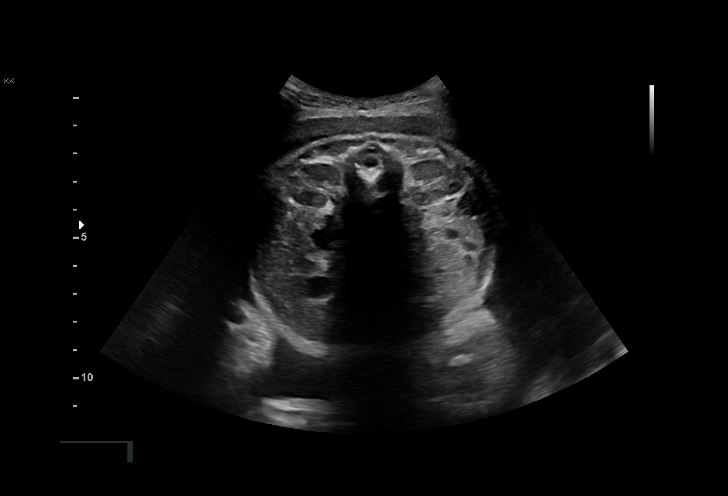
[im 6/25]
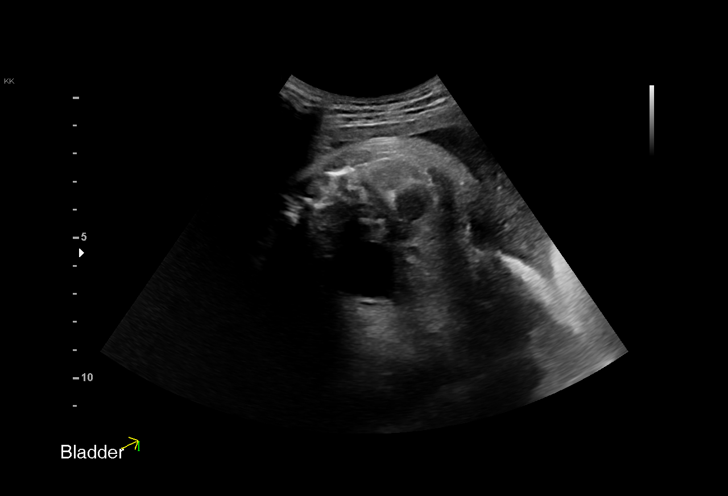
[im 8/25]
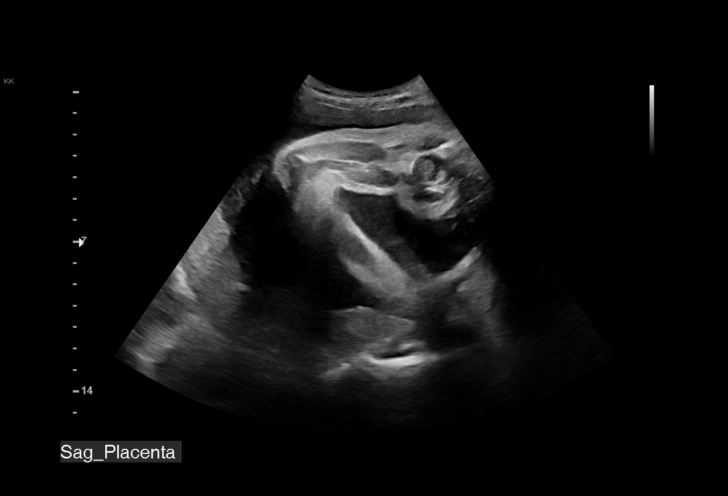
[im 10/25]
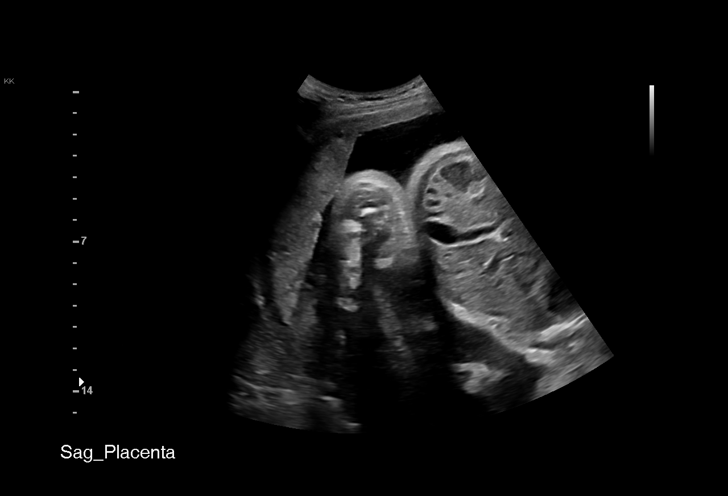
[im 11/25]
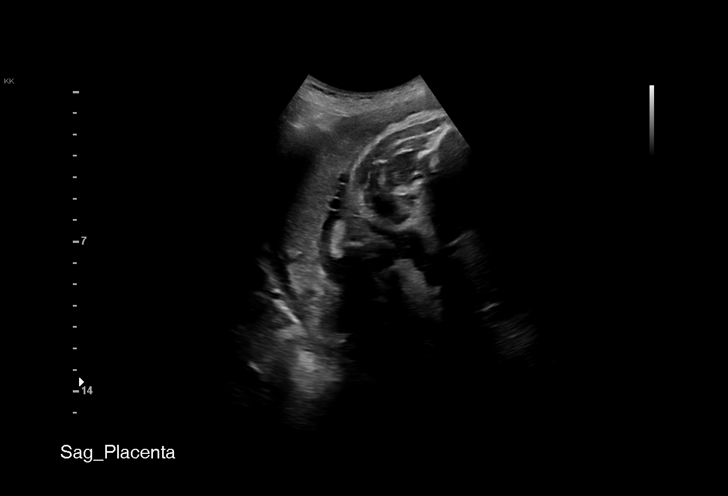
[im 13/25]
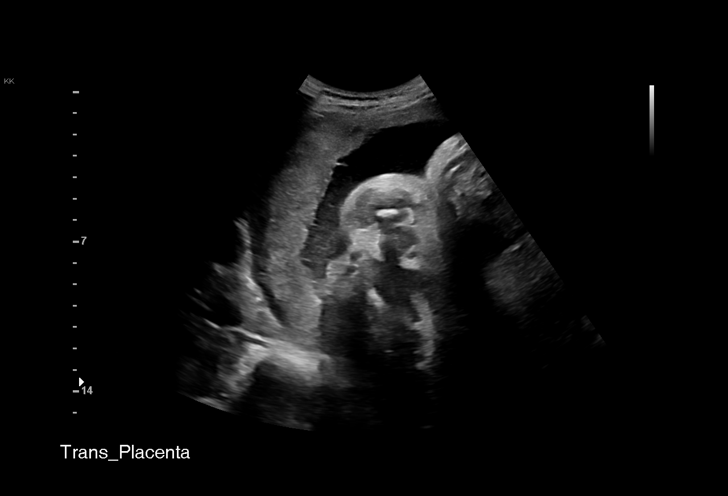
[im 15/25]
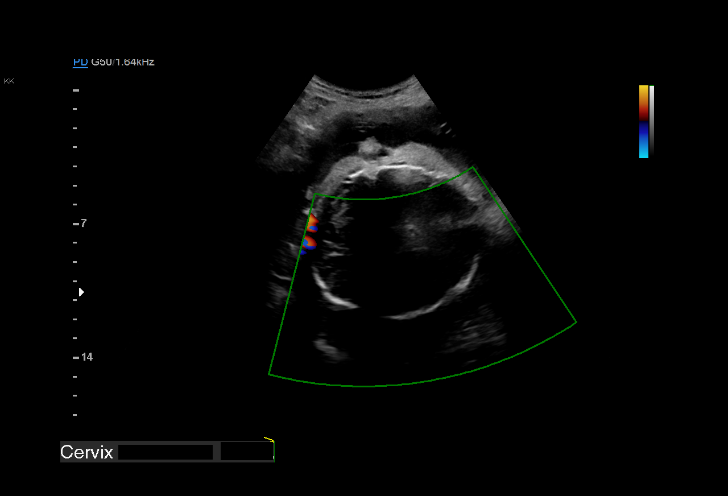
[im 16/25]
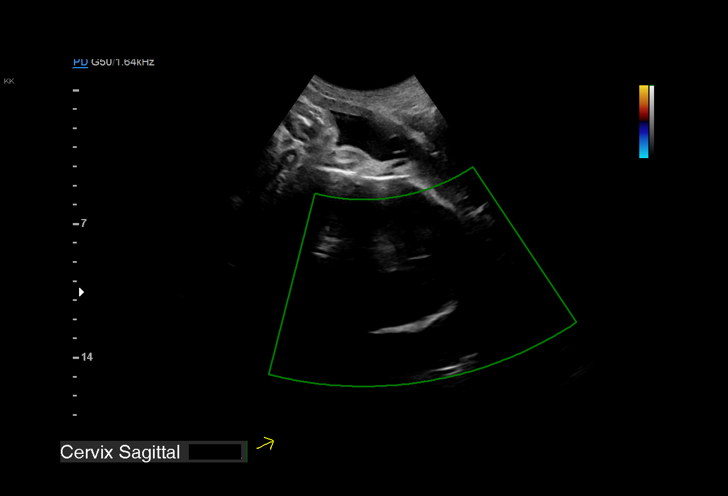
[im 18/25]
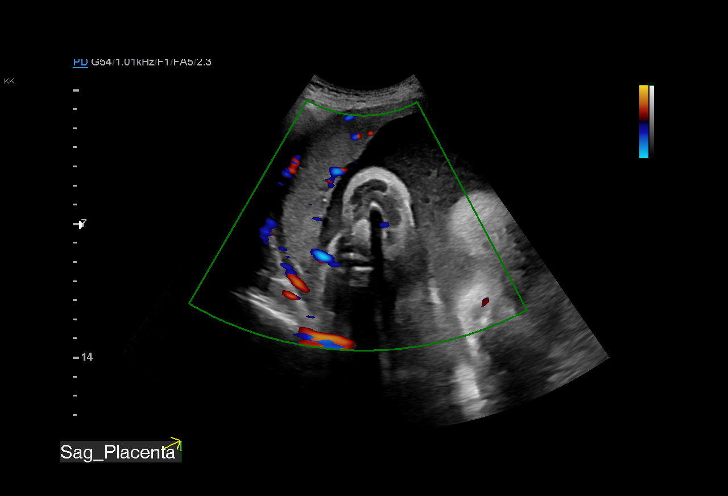
[im 20/25]
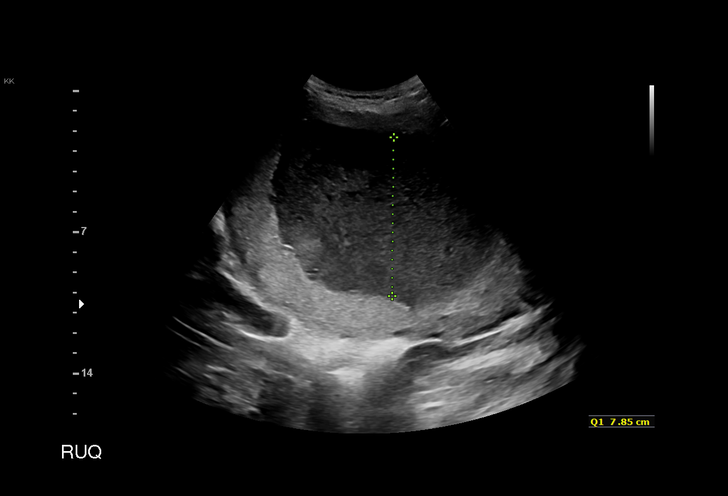
[im 21/25]
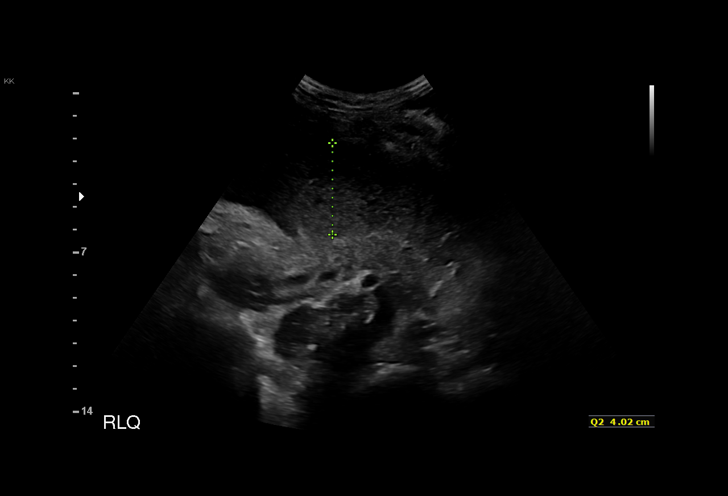
[im 23/25]
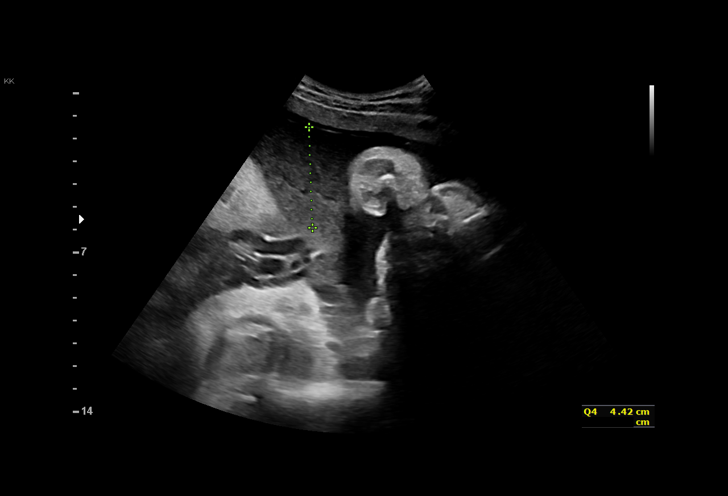
[im 25/25]
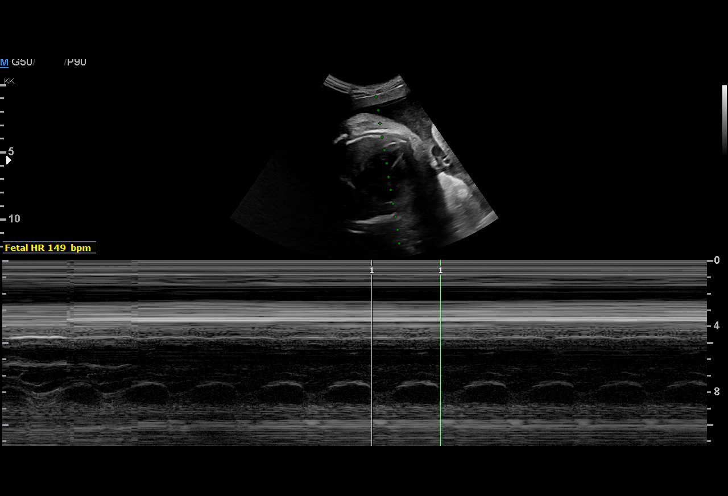

[15 of 25 positions shown; findings below may reference images not displayed]

[id number] [REDACTED]
Attending:        Harald Atluri         Secondary Phy.:   3rd Nursing- HR
OB

1  ZUKA KOGHUA KRUTSINGER          [id number]      [phone]     [id number]
Indications

33 weeks gestation of pregnancy
Preterm labor without delivery, third trimesterF0I.IO
OB History

Blood Type:            Height:  5'5"   Weight (lb):  130       BMI:
Gravidity:    1
Fetal Evaluation

Num Of Fetuses:     1
Fetal Heart         149
Rate(bpm):
Cardiac Activity:   Observed
Presentation:       Cephalic
Placenta:           Posterior, above cervical os
P. Cord Insertion:  Previously Visualized

Amniotic Fluid
AFI FV:      Subjectively within normal limits

AFI Sum(cm)     %Tile       Largest Pocket(cm)
20.59           78

RUQ(cm)       RLQ(cm)       LUQ(cm)        LLQ(cm)
7.85
Gestational Age
LMP:           33w 2d        Date:  04/13/17                 EDD:   01/18/18
Best:          33w 2d     Det. By:  LMP  (04/13/17)          EDD:   01/18/18
Cervix Uterus Adnexa

Cervix
Not visualized (advanced GA >18wks)
Impression

Singleton intrauterine pregnancy at 33+2 weeks with preterm
contractions here for requested limited scan
Normal fetal cardiac activity and movement
Cephalic presentation
Posterior placenta without previa
Amniotic fluid volume is normal
Recommendations

Continue clinical evaluation and management.

## 2018-12-22 DIAGNOSIS — R1031 Right lower quadrant pain: Secondary | ICD-10-CM | POA: Diagnosis not present

## 2018-12-22 DIAGNOSIS — Z1159 Encounter for screening for other viral diseases: Secondary | ICD-10-CM | POA: Diagnosis not present

## 2018-12-22 DIAGNOSIS — Z3200 Encounter for pregnancy test, result unknown: Secondary | ICD-10-CM | POA: Diagnosis not present

## 2018-12-22 DIAGNOSIS — Z118 Encounter for screening for other infectious and parasitic diseases: Secondary | ICD-10-CM | POA: Diagnosis not present

## 2018-12-22 DIAGNOSIS — Z113 Encounter for screening for infections with a predominantly sexual mode of transmission: Secondary | ICD-10-CM | POA: Diagnosis not present

## 2018-12-22 DIAGNOSIS — R109 Unspecified abdominal pain: Secondary | ICD-10-CM | POA: Diagnosis not present

## 2018-12-22 DIAGNOSIS — Z114 Encounter for screening for human immunodeficiency virus [HIV]: Secondary | ICD-10-CM | POA: Diagnosis not present

## 2018-12-22 DIAGNOSIS — N898 Other specified noninflammatory disorders of vagina: Secondary | ICD-10-CM | POA: Diagnosis not present

## 2020-05-24 NOTE — Progress Notes (Signed)
Cardiology Office Note   Date:  05/26/2020   ID:  Misty [name], Misty [name] 1991-07-21, MRN [id number]  PCP:  Misty Rua, Misty [name]    No chief complaint on file.  Mild AI  Wt Readings from Last 3 Encounters:  05/26/20 114 lb 9.6 oz (52 kg)  04/30/18 113 lb (51.3 kg)  01/06/18 111 lb (50.3 kg)       History of Present Illness: Misty [name] is a 29 y.o. female  with mildaortic insufficiency dx at age 69, tachycardia with a neg workup in the past. GXT in 2014 was normal. Echo in 2014 demonstrated normal EF and only trace AI.Echo in [place] in 06/2016 demonstrated trace AI and normal EF.   She had a baby in 4/19.  She had some shortness of breath post partum. Echo was repeated and showed normal LV function.  Normal Right side as well. .  Monitor in June 2019 showed:   NSR, sinus tachycardia with rare PACs and PVCs. No pathologic arrythmias.  She has had migraines more commonly. She has had nausea and vomiting with these.    Since the last visit, she had been doing well over the last 2 weeks.  She smoked some marijuana and then became anxious, HR was 150 and BP 180 systolic.    Pain has persisted.  She remains anxious.  She is hesitant to exercise.  She walks a lot at work.  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   She thinks she had COVID in 05/2019.  Close contact had it and she lost taste and smell.    Not vaccinated. She is nervous to take the vaccine.       Past Medical History:  Diagnosis Date  . Aortic insufficiency    Echo 5/14: EF 60-65, trace MR/AI // Echo 12/17 (in [place]):  EF 60-65, no RWMA, normal diastolic function, trace/mild AI, trace TR // Echo 6/19: EF 55-60, normal diastolic function, trivial AI   . History of exercise stress test    GXT 5/14:  No ischemic changes  . Migraines   . Palpitations    Holter 1/18:  NSR, HR 51-152  . SOB (shortness of breath)     No past surgical history on file.   Current Outpatient Medications   Medication Sig Dispense Refill  . doxycycline (VIBRA-TABS) 100 MG tablet Take 100 mg by mouth 2 (two) times daily.     No current facility-administered medications for this visit.    Allergies:   Fish allergy    Social History:  The patient  reports that she has quit smoking. She has never used smokeless tobacco. She reports current alcohol use. She reports that she does not use drugs.   Family History:  The patient's family history includes CVA in her paternal grandfather; Heart disease in her maternal grandmother; Hypertension in her father and maternal grandfather; Thyroid disease in her mother.    ROS:  Please see the history of present illness.   Otherwise, review of systems are positive for anxiety.   All other systems are reviewed and negative.    PHYSICAL EXAM: VS:  BP 104/64   Pulse 95   Ht 5\' 5"  (1.651 m)   Wt 114 lb 9.6 oz (52 kg)   SpO2 97%   BMI 19.07 kg/m  , BMI Body mass index is 19.07 kg/m. GEN: Well nourished, well developed, in no acute distress  HEENT: normal  Neck: no JVD, carotid bruits, or  masses Cardiac: RRR; no murmurs, rubs, or gallops,no edema  Respiratory:  clear to auscultation bilaterally, normal work of breathing GI: soft, nontender, nondistended, + BS MS: no deformity or atrophy ; mild tenderness to palpation to the left lower rib area Skin: warm and dry, no rash Neuro:  Strength and sensation are intact Psych: euthymic mood, full affect   EKG:   The ekg ordered today demonstrates NSR, no ST changes   Recent Labs: No results found for requested labs within last 8760 hours.   Lipid Panel No results found for: CHOL, TRIG, HDL, CHOLHDL, VLDL, LDLCALC, LDLDIRECT   Other studies Reviewed: Additional studies/ records that were reviewed today with results demonstrating: 2019 echo result reviewed.   ASSESSMENT AND PLAN:  1. AI: Trivial in the past.  No CHF sx at this time.  2. Chest pain: atypical. Seems like chest wall pain. She was  reassured by this and the normal ECG.  3. Palpitations: Well controlled.  She feels that her HR is fast in general and wants to have a prn beta blocker in the event it is bothersome.  She had propranolol in the past but has not used anything for some time.  Rx for metoprolol 12.5 mg BID.  4. She will focus on finding ways to deal with her anxiety without medicines like Xanax, or marijuana. She feels that she needs to stop using MJ given that she is a mother now.    Current medicines are reviewed at length with the patient today.  The patient concerns regarding her medicines were addressed.  The following changes have been made:  No change  Labs/ tests ordered today include:  No orders of the defined types were placed in this encounter.   Recommend 150 minutes/week of aerobic exercise Low fat, low carb, high fiber diet recommended  Disposition:   FU as needed   Signed, Lance Muss, Misty [name]  05/26/2020 3:20 PM    [facility] [address] [place], [place], [place]  [address] Phone: [phone]; Fax: [phone]

## 2020-05-26 ENCOUNTER — Other Ambulatory Visit: Payer: Self-pay

## 2020-05-26 ENCOUNTER — Encounter: Payer: Self-pay | Admitting: Interventional Cardiology

## 2020-05-26 ENCOUNTER — Ambulatory Visit: Payer: Federal, State, Local not specified - PPO | Admitting: Interventional Cardiology

## 2020-05-26 VITALS — BP 104/64 | HR 95 | Ht 65.0 in | Wt 114.6 lb

## 2020-05-26 DIAGNOSIS — R0789 Other chest pain: Secondary | ICD-10-CM | POA: Diagnosis not present

## 2020-05-26 DIAGNOSIS — R002 Palpitations: Secondary | ICD-10-CM | POA: Diagnosis not present

## 2020-05-26 DIAGNOSIS — I351 Nonrheumatic aortic (valve) insufficiency: Secondary | ICD-10-CM

## 2020-05-26 MED ORDER — METOPROLOL TARTRATE 25 MG PO TABS: 12.5000 mg | ORAL_TABLET | Freq: Two times a day (BID) | ORAL | 3 refills | Status: DC | PRN

## 2020-05-26 NOTE — Patient Instructions (Signed)
Medication Instructions:  Your physician has recommended you make the following change in your medication:   START: metoprolol tartrate (lopressor) 25 mg tablet: Take 1/2 tablet (12.5 mg) by mouth twice a day AS NEEDED for palpitations   *If you need a refill on your cardiac medications before your next appointment, please call your pharmacy*   Lab Work: None  If you have labs (blood work) drawn today and your tests are completely normal, you will receive your results only by: Marland Kitchen MyChart Message (if you have MyChart) OR . A paper copy in the mail If you have any lab test that is abnormal or we need to change your treatment, we will call you to review the results.   Testing/Procedures: None   Follow-Up: AS NEEDED   Other Instructions None

## 2020-07-22 NOTE — L&D Delivery Note (Signed)
Delivery Note At 4:00 AM a viable female was delivered via Vaginal, Spontaneous (Presentation: Left Occiput Anterior).  APGAR: per nursing but good cry/ tone, ; weight 9 lb 2 oz (4139 g).   Placenta status: Spontaneous, Intact.  Cord: 3 vessels with the following complications: None.  Cord pH: n/a  Anesthesia: None Episiotomy: None Lacerations:  2nd Suture Repair: 3.0 vicryl rapide Est. Blood Loss (mL):  350  Mom to postpartum.  Baby to Couplet care / Skin to Skin.  Misty [name] 05/20/2021, 4:20 AM

## 2020-09-12 LAB — OB RESULTS CONSOLE ABO/RH: RH Type: POSITIVE

## 2020-09-12 LAB — OB RESULTS CONSOLE ANTIBODY SCREEN: Antibody Screen: NEGATIVE

## 2020-12-22 ENCOUNTER — Ambulatory Visit: Payer: BC Managed Care – PPO | Admitting: Internal Medicine

## 2020-12-22 ENCOUNTER — Telehealth: Payer: Self-pay | Admitting: Interventional Cardiology

## 2020-12-22 ENCOUNTER — Other Ambulatory Visit: Payer: Self-pay

## 2020-12-22 ENCOUNTER — Encounter: Payer: Self-pay | Admitting: Internal Medicine

## 2020-12-22 VITALS — BP 100/50 | HR 78 | Ht 65.0 in | Wt 113.0 lb

## 2020-12-22 DIAGNOSIS — I479 Paroxysmal tachycardia, unspecified: Secondary | ICD-10-CM | POA: Diagnosis not present

## 2020-12-22 NOTE — Telephone Encounter (Signed)
Patient c/o Palpitations:  High priority if patient c/o lightheadedness, shortness of breath, or chest pain  1) How long have you had palpitations/irregular HR/ Afib? Are you having the symptoms now? Off and on for a good bit of time has recently got more uncomfortable. Is having symptoms now.   2) Are you currently experiencing lightheadedness, SOB or CP? No, only lightheaded when standing up   3) Do you have a history of afib (atrial fibrillation) or irregular heart rhythm? No   4) Have you checked your BP or HR? (document readings if available): HR sitting 111, hasn't checked BP.  5) Are you experiencing any other symptoms? SOB when moving around, feels heart is skipping a beat especially when taking a deep breathe.    Is wanting to be worked in for an appointment next week due to being pregnant and concerned.

## 2020-12-22 NOTE — Telephone Encounter (Signed)
Spoke with pt who reports she is [redacted] weeks pregnant and has been having increasing palpitations and tachycardia worse today.  She reports her resting HR is 111 and "feels very fast" when walking around.  She has not been taking Metoprolol.  She would like to be seen as she feels "uncomfortable". She denies any syncopal episodes.  Appointment scheduled for today at 315pm with Dietrich Pates, DOD as Dr Eldridge Dace does not have any appointment availability today.  Pt verbalizes understanding and agrees with current plan.

## 2020-12-22 NOTE — Progress Notes (Signed)
Cardiology Office Note   Date:  12/22/2020   ID:  Misty [name], Misty [name] Mar 01, 1991, MRN [id number]  PCP:  [name], MD  Cardiologist:   Dietrich Pates, MD       History of Present Illness: Misty [name] is a 30 y.o. female with a history of tachycardia in past (neg work up; GXT normal in 2014; echo normal LVEF and trace AI in 2014 and 2017. The pt is followed by Catalina Gravel in clinic   Last seen in Nov 2021  The pt is currently [redacted] wks pregnant in 2nd pregnancy (first pregnancy without problem).   She says she has been noticing her heart racing more.  Notes some mild dizziness at times   Nothing severe, no syncope Breathing a little short when heart racing   Remains active    Trying to drink fluids   About 50 oz per day Br:   Bagel Lunch  Taco Dinner:   Pasta or rice boal        Current Meds  Medication Sig  . Prenatal Multivit-Min-Fe-FA (PRE-NATAL PO) Take 2 tablets by mouth daily.  . [DISCONTINUED] doxycycline (VIBRA-TABS) 100 MG tablet Take 100 mg by mouth 2 (two) times daily.  . [DISCONTINUED] metoprolol tartrate (LOPRESSOR) 25 MG tablet Take 0.5 tablets (12.5 mg total) by mouth 2 (two) times daily as needed (palpitations).     Allergies:   Fish allergy and Other   Past Medical History:  Diagnosis Date  . Aortic insufficiency    Echo 5/14: EF 60-65, trace MR/AI // Echo 12/17 (in [place]):  EF 60-65, no RWMA, normal diastolic function, trace/mild AI, trace TR // Echo 6/19: EF 55-60, normal diastolic function, trivial AI   . History of exercise stress test    GXT 5/14:  No ischemic changes  . Migraines   . Palpitations    Holter 1/18:  NSR, HR 51-152  . SOB (shortness of breath)     History reviewed. No pertinent surgical history.   Social History:  The patient  reports that she has quit smoking. She has never used smokeless tobacco. She reports current alcohol use. She reports that she does not use drugs.   Family History:  The patient's family history includes  CVA in her paternal grandfather; Heart disease in her maternal grandmother; Hypertension in her father and maternal grandfather; Thyroid disease in her mother.    ROS:  Please see the history of present illness. All other systems are reviewed and  Negative to the above problem except as noted.    PHYSICAL EXAM: VS:  BP (!) 100/50   Pulse 78   Ht 5\' 5"  (1.651 m)   Wt 113 lb (51.3 kg)   SpO2 95%   BMI 18.80 kg/m   Orthostatics: Laying:  BP 102/60  P 96   Sitting:  102/65  P 96 Standing  102/70  P 104   4 min:   102/72  P 108    GEN: Thin 30 yo, in no acute distress  HEENT: normal  Neck: no JVD, carotid bruits, or masses Cardiac: RRR; no murmurs, rubs, or gallops,no edema  Respiratory:  clear to auscultation bilaterally,  GI: soft, nontender  No hepatomegaly   Protuberant + BS  MS: no deformity Moving all extremities   Skin: warm and dry, no rash Neuro:  Strength and sensation are intact Psych: euthymic mood, full affect   EKG:  EKG is not ordered today.   Lipid Panel No results found for: CHOL,  TRIG, HDL, CHOLHDL, VLDL, LDLCALC, LDLDIRECT    Wt Readings from Last 3 Encounters:  12/22/20 113 lb (51.3 kg)  05/26/20 114 lb 9.6 oz (52 kg)  04/30/18 113 lb (51.3 kg)      ASSESSMENT AND PLAN:  1  Tachycardia   The pt has noticed more episodes of heart racing since pregnant  On exm, she is not orthostatic   HR does increase a little but insignif with standing    Othersise exam is unremarkable Overall, I think the tachycardia is a normal response to changes / accomodations of pregnancy    Pt currently very busy as already has one child   This probably exacerbates things some  I have spent extensive time with patient explaining the physiology of pregnancy and the bodies responses.    I tried to reassure her   From a cardiac standpoint I think she is doing welll and should do well with pregnancy / delivery No definite follow up during pregnancy    Encouraged her to keep  hydrated, increase salt some, take activities as tolerated   Current medicines are reviewed at length with the patient today.  The patient does not have concerns regarding medicines.  Signed, Dietrich Pates, MD  12/22/2020 4:05 PM    [facility] [address] [place], [place], [place]  [address] Phone: [phone]; Fax: [phone]

## 2020-12-22 NOTE — Telephone Encounter (Signed)
Patient scheduled in clinic today

## 2020-12-25 NOTE — Patient Instructions (Signed)
Medication Instructions:  °No changes °*If you need a refill on your cardiac medications before your next appointment, please call your pharmacy* ° ° °Lab Work: °none °If you have labs (blood work) drawn today and your tests are completely normal, you will receive your results only by: °MyChart Message (if you have MyChart) OR °A paper copy in the mail °If you have any lab test that is abnormal or we need to change your treatment, we will call you to review the results. ° ° °Testing/Procedures: °none ° ° °Follow-Up: °As needed ° °Other Instructions °  °

## 2021-02-21 ENCOUNTER — Encounter: Payer: Self-pay | Admitting: Podiatry

## 2021-02-21 ENCOUNTER — Ambulatory Visit: Payer: BC Managed Care – PPO | Admitting: Podiatry

## 2021-02-21 ENCOUNTER — Other Ambulatory Visit: Payer: Self-pay

## 2021-02-21 DIAGNOSIS — B070 Plantar wart: Secondary | ICD-10-CM | POA: Diagnosis not present

## 2021-02-22 NOTE — Progress Notes (Signed)
Subjective:   Patient ID: Misty [name], female   DOB: 30 y.o.   MRN: [id number]   HPI Patient states she is got a lot of pain in the bottom of the right foot and thinks maybe there is something there and she is [redacted] weeks pregnant.  Patient is worse when she tries to walk on it and does not smoke and is trying to be active   Review of Systems  All other systems reviewed and are negative.      Objective:  Physical Exam Vitals and nursing note reviewed.  Constitutional:      Appearance: She is well-developed.  Pulmonary:     Effort: Pulmonary effort is normal.  Musculoskeletal:        General: Normal range of motion.  Skin:    General: Skin is warm.  Neurological:     Mental Status: She is alert.    Neurovascular status intact muscle strength found to be adequate range of motion adequate.  Patient is noted to have a lesion on the plantar lateral aspect of the right heel that upon debridement it measures about 5 x 5 mm painful to lateral pressure with pinpoint bleeding.  Patient is found have good digital perfusion well oriented      Assessment:  Probability for verruca plantaris plantar aspect right     Plan:  H&P educated patient debridement of lesion applied a small amount of salicylic acid topically and instructed to leave this on 24 hours.  Reappoint as needed may require excision in future

## 2021-03-28 ENCOUNTER — Inpatient Hospital Stay (HOSPITAL_COMMUNITY)
Admission: AD | Admit: 2021-03-28 | Discharge: 2021-03-28 | Disposition: A | Discharge status: Home / Self Care | Payer: BC Managed Care – PPO | Source: Ambulatory Visit | Attending: Obstetrics and Gynecology | Admitting: Obstetrics and Gynecology

## 2021-03-28 ENCOUNTER — Other Ambulatory Visit: Payer: Self-pay

## 2021-03-28 ENCOUNTER — Encounter (HOSPITAL_COMMUNITY): Payer: Self-pay | Admitting: Obstetrics and Gynecology

## 2021-03-28 DIAGNOSIS — O09213 Supervision of pregnancy with history of pre-term labor, third trimester: Secondary | ICD-10-CM | POA: Insufficient documentation

## 2021-03-28 DIAGNOSIS — O4703 False labor before 37 completed weeks of gestation, third trimester: Secondary | ICD-10-CM

## 2021-03-28 DIAGNOSIS — Z3A31 31 weeks gestation of pregnancy: Secondary | ICD-10-CM

## 2021-03-28 LAB — URINALYSIS, ROUTINE W REFLEX MICROSCOPIC
Bilirubin Urine: NEGATIVE
Glucose, UA: NEGATIVE mg/dL
Hgb urine dipstick: NEGATIVE
Ketones, ur: NEGATIVE mg/dL
Nitrite: NEGATIVE
Protein, ur: NEGATIVE mg/dL
Specific Gravity, Urine: <1.005 — ABNORMAL LOW (ref 1.005–1.030)
pH: 7 (ref 5.0–8.0)

## 2021-03-28 LAB — URINALYSIS, MICROSCOPIC (REFLEX): Bacteria, UA: NONE SEEN

## 2021-03-28 MED ORDER — BETAMETHASONE SOD PHOS & ACET 6 (3-3) MG/ML IJ SUSP
12.0000 mg | Freq: Once | INTRAMUSCULAR | Status: AC
  Administered 2021-03-28: 12 mg via INTRAMUSCULAR
  Filled 2021-03-28: qty 5

## 2021-03-28 NOTE — MAU Provider Note (Signed)
History     [id number]  Arrival date and time: 03/28/21 1042    Chief Complaint  Patient presents with   Contractions     HPI Misty [name] is a 30 y.o. at [redacted]w[redacted]d with PMHx notable for preterm birth (first daughter was born at 26 wks after spontaneous labor), who presents for contractions. Symptoms started this morning. Had painful contractions every 3 minutes prior to arrival. States since she's been resting in MAU they have decreased. .   Vaginal bleeding: No LOF: No Fetal Movement: Yes Contractions: Yes  Review of outside prenatal records from [facility] ob/gyn Office (in media tab)      OB History     Gravida  2   Para  1   Term      Preterm  1   AB      Living  1      SAB      IAB      Ectopic      Multiple      Live Births  1           Past Medical History:  Diagnosis Date   Aortic insufficiency    Echo 5/14: EF 60-65, trace MR/AI // Echo 12/17 (in [place]):  EF 60-65, no RWMA, normal diastolic function, trace/mild AI, trace TR // Echo 6/19: EF 55-60, normal diastolic function, trivial AI    History of exercise stress test    GXT 5/14:  No ischemic changes   Migraines    Palpitations    Holter 1/18:  NSR, HR 51-152   SOB (shortness of breath)     Past Surgical History:  Procedure Laterality Date   NO PAST SURGERIES      Family History  Problem Relation Age of Onset   Thyroid disease Mother    Hypertension Father    Heart disease Maternal Grandmother    Hypertension Maternal Grandfather    CVA Paternal Grandfather     Social History   Socioeconomic History   Marital status: Single    Spouse name: Not on file   Number of children: Not on file   Years of education: Not on file   Highest education level: Not on file  Occupational History   Not on file  Tobacco Use   Smoking status: Former   Smokeless tobacco: Never  Vaping Use   Vaping Use: Never used  Substance and Sexual Activity   Alcohol use: Yes    Alcohol/week: 0.0  standard drinks    Comment: occasional   Drug use: No   Sexual activity: Not on file  Other Topics Concern   Not on file  Social History Narrative   Not on file   Social Determinants of Health   Financial Resource Strain: Not on file  Food Insecurity: Not on file  Transportation Needs: Not on file  Physical Activity: Not on file  Stress: Not on file  Social Connections: Not on file  Intimate Partner Violence: Not on file    Allergies  Allergen Reactions   Fish Allergy Swelling   Other Other (See Comments)    No current facility-administered medications on file prior to encounter.   Current Outpatient Medications on File Prior to Encounter  Medication Sig Dispense Refill   Prenatal Multivit-Min-Fe-FA (PRE-NATAL PO) Take 2 tablets by mouth daily.       Review of Systems  Constitutional: Negative.   Gastrointestinal:  Positive for abdominal pain. Negative for nausea and vomiting.  Genitourinary: Negative.  Pertinent positives and negative per HPI, all others reviewed and negative  Physical Exam   BP 111/67 (BP Location: Right Arm)   Pulse 87   Temp 98.4 F (36.9 C) (Oral)   Resp 18   Ht 5\' 5"  (1.651 m)   Wt 60.4 kg   SpO2 99%   BMI 22.15 kg/m   Physical Exam Vitals and nursing note reviewed. Exam conducted with a chaperone present.  Constitutional:      General: She is not in acute distress.    Appearance: Normal appearance. She is not ill-appearing.  HENT:     Head: Normocephalic and atraumatic.  Eyes:     General: No scleral icterus.    Conjunctiva/sclera: Conjunctivae normal.  Pulmonary:     Effort: Pulmonary effort is normal. No respiratory distress.  Abdominal:     Palpations: Abdomen is soft.     Tenderness: There is no abdominal tenderness.     Comments: gravid  Genitourinary:    Comments:   Skin:    General: Skin is warm and dry.  Neurological:     Mental Status: She is alert.  Psychiatric:        Mood and Affect: Mood normal.         Behavior: Behavior normal.    Cervical Exam Dilation: 1 Effacement (%): 50 Cervical Position: Posterior Station: Ballotable Exam by:: [email or web address] NP  FHT Baseline 140, moderate variability, 15x15 accels, no decels Toco: UI Cat: 1  Labs Results for orders placed or performed during the hospital encounter of 03/28/21 (from the past 24 hour(s))  Urinalysis, Routine w reflex microscopic Urine, Clean Catch     Status: Abnormal   Collection Time: 03/28/21 11:10 AM  Result Value Ref Range   Color, Urine YELLOW YELLOW   APPearance CLEAR CLEAR   Specific Gravity, Urine <1.005 (L) 1.005 - 1.030   pH 7.0 5.0 - 8.0   Glucose, UA NEGATIVE NEGATIVE mg/dL   Hgb urine dipstick NEGATIVE NEGATIVE   Bilirubin Urine NEGATIVE NEGATIVE   Ketones, ur NEGATIVE NEGATIVE mg/dL   Protein, ur NEGATIVE NEGATIVE mg/dL   Nitrite NEGATIVE NEGATIVE   Leukocytes,Ua SMALL (A) NEGATIVE  Urinalysis, Microscopic (reflex)     Status: None   Collection Time: 03/28/21 11:10 AM  Result Value Ref Range   RBC / HPF 0-5 0 - 5 RBC/hpf   WBC, UA 0-5 0 - 5 WBC/hpf   Bacteria, UA NONE SEEN NONE SEEN   Squamous Epithelial / LPF 0-5 0 - 5    Imaging No results found.  MAU Course  Procedures Lab Orders         Urinalysis, Routine w reflex microscopic Urine, Clean Catch         Urinalysis, Microscopic (reflex)    Meds ordered this encounter  Medications   betamethasone acetate-betamethasone sodium phosphate (CELESTONE) injection 12 mg   Imaging Orders  No imaging studies ordered today    MDM Patient with hx of preterm delivery presents with contractions. Initially had some irregular irritability on the monitor that resolved during her stay without intervention. Cervix dilated 1/50/ballotable - unchanged after 2 hours of monitoring. Reports resolution of contractions. Given history & dilation, she will be given betamethasone for fetal lung maturation.  She has appointment with Dr. 05/28/21 tomorrow - I spoke with  Dr. Billy Coast to confirm that BMZ was available to be given in his office which he confirmed. He is aware of the patient & is agreeable with this plan.   Assessment  and Plan   1. Preterm uterine contractions in third trimester, antepartum   2. [redacted] weeks gestation of pregnancy    -Discharge home -reviewed preterm labor precautions & reasons to return to MAU -keep appt with Dr. Billy Coast tomorrow  Judeth Horn, NP

## 2021-03-28 NOTE — MAU Note (Signed)
Presents with c/o ctxs that began @ 0845 this morning.  Denies VB or LOF.  Endorses +FM.  Denies recent intercourse.

## 2021-03-29 ENCOUNTER — Telehealth: Payer: Self-pay | Admitting: Interventional Cardiology

## 2021-03-29 NOTE — Telephone Encounter (Signed)
Spoke with the patient who reports that she received a steroid injection yesterday and this morning.  She has since developed some chest tightness while moving around. She states that her HR also increases when she is moving. This is not new for her. States that heart rate at rest is 92 and gets up to 120-130 when moving. She also reports SOB when moving. She spoke to her OBGYN about this and in regards to flushing feeling that she has been having. OBGYN did not think that chest tightness was related to injections.  Patient has a history of atypical chest pain. She states that this is a different feeling. She states that it is not a pain but a tightness across her chest.  Advised I will make Dr. Eldridge Dace aware. Patient is also aware of ER precautions for chest pain.

## 2021-03-29 NOTE — Telephone Encounter (Signed)
Pt c/o of Chest Pain: STAT if CP now or developed within 24 hours  1. Are you having CP right now? no  2. Are you experiencing any other symptoms (ex. SOB, nausea, vomiting, sweating)? sob  3. How long have you been experiencing CP? today  4. Is your CP continuous or coming and going? Coming and going  5. Have you taken Nitroglycerin? No  Patient states that she is [redacted] weeks pregnant  ?

## 2021-03-29 NOTE — Telephone Encounter (Signed)
COntinue to monitor.  Unlikely to be cardiac based on history.  Watch for any exertional sx.   .ext

## 2021-03-30 NOTE — Telephone Encounter (Signed)
Patient notified. She is feeling much better today and thinks symptoms were related to injection. She will let us know if symptoms continue with exertion

## 2021-04-24 LAB — OB RESULTS CONSOLE RPR: RPR: NONREACTIVE

## 2021-05-07 ENCOUNTER — Telehealth: Payer: Self-pay | Admitting: Interventional Cardiology

## 2021-05-07 NOTE — Telephone Encounter (Signed)
I spoke with patient. She reports when she saw Dr Tenny Craw earlier this year she was having episodes of fast heart rate.  She is now having skipped beats. She mostly notices when lying down. She will notice a skipped beat about every 15 seconds or so.  Goes back to normal when she sits up.  Is able to sleep at night. She is staying hydrated.  Patient is seeing her OB doctor on Wednesday and will talk with them about this.  Will forward to Dr Eldridge Dace for review/recommendations.

## 2021-05-07 NOTE — Telephone Encounter (Signed)
Patient c/o Palpitations:  High priority if patient c/o lightheadedness, shortness of breath, or chest pain  How long have you had palpitations/irregular HR/ Afib? Are you having the symptoms now?  Patient states she has had palpitations for the majority of her pregnancy (currently 38 weeks), but they are becoming more frequent. She states they have always been when she lays down, but now she is having them every time she lays down. She would like to know whether or not this is normal. Are you currently experiencing lightheadedness, SOB or CP?  No  Do you have a history of afib (atrial fibrillation) or irregular heart rhythm?  No  Have you checked your BP or HR? (document readings if available):  BP 110/65  HR in 80's  Are you experiencing any other symptoms?  No

## 2021-05-10 ENCOUNTER — Encounter: Payer: Self-pay | Admitting: Cardiology

## 2021-05-10 ENCOUNTER — Ambulatory Visit: Payer: BC Managed Care – PPO | Admitting: Cardiology

## 2021-05-10 ENCOUNTER — Other Ambulatory Visit: Payer: Self-pay

## 2021-05-10 VITALS — BP 110/76 | HR 97 | Ht 65.0 in | Wt 138.0 lb

## 2021-05-10 DIAGNOSIS — R002 Palpitations: Secondary | ICD-10-CM | POA: Diagnosis not present

## 2021-05-10 NOTE — Progress Notes (Signed)
Cardiology Office Note:    Date:  05/10/2021   ID:  Tinya, Cadogan 26-Nov-1990, MRN [id number]  PCP:  [name], MD   [facility] Providers Cardiologist:  Donato Schultz, MD     Referring MD: [name], MD     History of Present Illness:    Misty [name] is a 30 y.o. female with 38-week pregnancy who has been having more episodes of fast heart rate, palpitations, skipping mostly noticing when she is laying down.  Happens maybe every 15 seconds.  Seems to go back to normal when sitting up.  She is staying hydrated. Mostly the sensations occur later in the day when she is laying down and when her heart rate is decreasing.  She has had resting tachycardia at times.  This was worked up in 2019 and was unremarkable.  She did have a Holter monitor that did show occasional PVCs PACs.  Echocardiogram structurally normal.  She has a 19-year-old son   Past Medical History:  Diagnosis Date   Aortic insufficiency    Echo 5/14: EF 60-65, trace MR/AI // Echo 12/17 (in [place]):  EF 60-65, no RWMA, normal diastolic function, trace/mild AI, trace TR // Echo 6/19: EF 55-60, normal diastolic function, trivial AI    History of exercise stress test    GXT 5/14:  No ischemic changes   Migraines    Palpitations    Holter 1/18:  NSR, HR 51-152   SOB (shortness of breath)     Past Surgical History:  Procedure Laterality Date   NO PAST SURGERIES      Current Medications: Current Meds  Medication Sig   Prenatal Multivit-Min-Fe-FA (PRE-NATAL PO) Take 2 tablets by mouth daily.     Allergies:   Fish allergy and Other   Social History   Socioeconomic History   Marital status: Single    Spouse name: Not on file   Number of children: Not on file   Years of education: Not on file   Highest education level: Not on file  Occupational History   Not on file  Tobacco Use   Smoking status: Former   Smokeless tobacco: Never  Vaping Use   Vaping Use: Never used  Substance and Sexual  Activity   Alcohol use: Yes    Alcohol/week: 0.0 standard drinks    Comment: occasional   Drug use: No   Sexual activity: Not on file  Other Topics Concern   Not on file  Social History Narrative   Not on file   Social Determinants of Health   Financial Resource Strain: Not on file  Food Insecurity: Not on file  Transportation Needs: Not on file  Physical Activity: Not on file  Stress: Not on file  Social Connections: Not on file     Family History: The patient's family history includes CVA in her paternal grandfather; Heart disease in her maternal grandmother; Hypertension in her father and maternal grandfather; Thyroid disease in her mother.  ROS:   Please see the history of present illness.    No higher symptoms such as syncope.  No bleeding no fevers no chills all other systems reviewed and are negative.  EKGs/Labs/Other Studies Reviewed:    The following studies were reviewed today: ECHO 2019:  - Left ventricle: The cavity size was normal. Wall thickness was    normal. Systolic function was normal. The estimated ejection    fraction was in the range of 55% to 60%. Left ventricular    diastolic function  parameters were normal.  - Aortic valve: There was trivial regurgitation.   EKG:  EKG is  ordered today.  The ekg ordered today demonstrates SR 97  Recent Labs: No results found for requested labs within last 8760 hours.  Recent Lipid Panel No results found for: CHOL, TRIG, HDL, CHOLHDL, VLDL, LDLCALC, LDLDIRECT   Risk Assessment/Calculations:          Physical Exam:    VS:  BP 110/76 (BP Location: Left Arm, Patient Position: Sitting, Cuff Size: Normal)   Pulse 97   Ht 5\' 5"  (1.651 m)   Wt 138 lb (62.6 kg)   BMI 22.96 kg/m     Wt Readings from Last 3 Encounters:  05/10/21 138 lb (62.6 kg)  03/28/21 133 lb 1.6 oz (60.4 kg)  12/22/20 113 lb (51.3 kg)     GEN:  Well nourished, well developed in no acute distress, HEENT: Normal NECK: No JVD; No  carotid bruits LYMPHATICS: No lymphadenopathy CARDIAC: RRR, no murmurs, rubs, gallops RESPIRATORY:  Clear to auscultation without rales, wheezing or rhonchi  ABDOMEN: Soft, non-tender, non-distended MUSCULOSKELETAL:  No edema; No deformity  SKIN: Warm and dry NEUROLOGIC:  Alert and oriented x 3 PSYCHIATRIC:  Normal affect   ASSESSMENT:    1. Palpitations    PLAN:    In order of problems listed above:  Palpitations Palpitations description are classic for PVCs/PACs.  These were detected previously on Holter monitor in 2019.  These are benign.  EKG today was reassuring showing sinus rhythm heart rate 97 bpm.  She is reassured.  Prior echocardiogram showed normal structure and function.  Reassurance has been given.  Continue with hydration.  She is not drinking any caffeine.  She is on no supplements        Medication Adjustments/Labs and Tests Ordered: Current medicines are reviewed at length with the patient today.  Concerns regarding medicines are outlined above.  Orders Placed This Encounter  Procedures   EKG 12-Lead   No orders of the defined types were placed in this encounter.   Patient Instructions  Medication Instructions:  The current medical regimen is effective;  continue present plan and medications.  *If you need a refill on your cardiac medications before your next appointment, please call your pharmacy*  Follow-Up: At [facility], you and your health needs are our priority.  As part of our continuing mission to provide you with exceptional heart care, we have created designated Provider Care Teams.  These Care Teams include your primary Cardiologist (physician) and Advanced Practice Providers (APPs -  Physician Assistants and Nurse Practitioners) who all work together to provide you with the care you need, when you need it.  We recommend signing up for the patient portal called "MyChart".  Sign up information is provided on this After Visit Summary.  MyChart  is used to connect with patients for Virtual Visits (Telemedicine).  Patients are able to view lab/test results, encounter notes, upcoming appointments, etc.  Non-urgent messages can be sent to your provider as well.   To learn more about what you can do with MyChart, go to [facility].    Your next appointment:   Follow up as instructed by Dr [email or web address].  Thank you for choosing [facility]!!     Signed, [facility], MD  05/10/2021 3:43 PM    [facility]

## 2021-05-10 NOTE — Patient Instructions (Signed)
Medication Instructions:  The current medical regimen is effective;  continue present plan and medications.  *If you need a refill on your cardiac medications before your next appointment, please call your pharmacy*  Follow-Up: At [facility], you and your health needs are our priority.  As part of our continuing mission to provide you with exceptional heart care, we have created designated Provider Care Teams.  These Care Teams include your primary Cardiologist (physician) and Advanced Practice Providers (APPs -  Physician Assistants and Nurse Practitioners) who all work together to provide you with the care you need, when you need it.  We recommend signing up for the patient portal called "MyChart".  Sign up information is provided on this After Visit Summary.  MyChart is used to connect with patients for Virtual Visits (Telemedicine).  Patients are able to view lab/test results, encounter notes, upcoming appointments, etc.  Non-urgent messages can be sent to your provider as well.   To learn more about what you can do with MyChart, go to [email or web address].    Your next appointment:   Follow up as instructed by Dr Eldridge Dace.  Thank you for choosing [facility]!!

## 2021-05-10 NOTE — Assessment & Plan Note (Signed)
Palpitations description are classic for PVCs/PACs.  These were detected previously on Holter monitor in 2019.  These are benign.  EKG today was reassuring showing sinus rhythm heart rate 97 bpm.  She is reassured.  Prior echocardiogram showed normal structure and function.  Reassurance has been given.  Continue with hydration.  She is not drinking any caffeine.  She is on no supplements

## 2021-05-10 NOTE — Telephone Encounter (Signed)
PT States that palpitations have gotten worse please advise

## 2021-05-10 NOTE — Telephone Encounter (Signed)
I spoke with patient. She reports palpitations are happening more often now. Feels fluttering and increased heart rate at times.  Appointment made for patient to see Dr Anne Fu today at 3:00

## 2021-05-11 NOTE — Telephone Encounter (Signed)
She has had benign rhythm evals in the past.

## 2021-05-17 ENCOUNTER — Encounter (HOSPITAL_COMMUNITY): Payer: Self-pay | Admitting: *Deleted

## 2021-05-17 ENCOUNTER — Encounter (HOSPITAL_COMMUNITY): Payer: Self-pay

## 2021-05-17 ENCOUNTER — Telehealth (HOSPITAL_COMMUNITY): Payer: Self-pay | Admitting: *Deleted

## 2021-05-17 NOTE — Telephone Encounter (Signed)
Preadmission screen  

## 2021-05-18 ENCOUNTER — Other Ambulatory Visit: Payer: Self-pay | Admitting: Obstetrics and Gynecology

## 2021-05-18 LAB — SARS CORONAVIRUS 2 (TAT 6-24 HRS): SARS Coronavirus 2: NEGATIVE

## 2021-05-20 ENCOUNTER — Inpatient Hospital Stay (HOSPITAL_COMMUNITY)
Admission: AD | Admit: 2021-05-20 | Discharge: 2021-05-21 | DRG: 806 | Disposition: A | Discharge status: Home / Self Care | Payer: BC Managed Care – PPO | Attending: Obstetrics | Admitting: Obstetrics

## 2021-05-20 ENCOUNTER — Encounter (HOSPITAL_COMMUNITY): Payer: Self-pay | Admitting: Obstetrics

## 2021-05-20 ENCOUNTER — Other Ambulatory Visit: Payer: Self-pay

## 2021-05-20 DIAGNOSIS — O99892 Other specified diseases and conditions complicating childbirth: Secondary | ICD-10-CM | POA: Diagnosis present

## 2021-05-20 DIAGNOSIS — R000 Tachycardia, unspecified: Secondary | ICD-10-CM | POA: Diagnosis present

## 2021-05-20 DIAGNOSIS — Z87891 Personal history of nicotine dependence: Secondary | ICD-10-CM

## 2021-05-20 DIAGNOSIS — O26893 Other specified pregnancy related conditions, third trimester: Secondary | ICD-10-CM | POA: Diagnosis present

## 2021-05-20 DIAGNOSIS — D72829 Elevated white blood cell count, unspecified: Secondary | ICD-10-CM | POA: Diagnosis present

## 2021-05-20 DIAGNOSIS — O9912 Other diseases of the blood and blood-forming organs and certain disorders involving the immune mechanism complicating childbirth: Secondary | ICD-10-CM | POA: Diagnosis present

## 2021-05-20 DIAGNOSIS — Z3A39 39 weeks gestation of pregnancy: Secondary | ICD-10-CM | POA: Diagnosis not present

## 2021-05-20 LAB — CBC WITH DIFFERENTIAL/PLATELET
Abs Immature Granulocytes: 0.13 10*3/uL — ABNORMAL HIGH (ref 0.00–0.07)
Basophils Absolute: 0 10*3/uL (ref 0.0–0.1)
Basophils Relative: 0 %
Eosinophils Absolute: 0.4 10*3/uL (ref 0.0–0.5)
Eosinophils Relative: 2 %
HCT: 34 % — ABNORMAL LOW (ref 36.0–46.0)
Hemoglobin: 11.3 g/dL — ABNORMAL LOW (ref 12.0–15.0)
Immature Granulocytes: 1 %
Lymphocytes Relative: 10 %
Lymphs Abs: 2.2 10*3/uL (ref 0.7–4.0)
MCH: 31.5 pg (ref 26.0–34.0)
MCHC: 33.2 g/dL (ref 30.0–36.0)
MCV: 94.7 fL (ref 80.0–100.0)
Monocytes Absolute: 0.8 10*3/uL (ref 0.1–1.0)
Monocytes Relative: 4 %
Neutro Abs: 18.6 10*3/uL — ABNORMAL HIGH (ref 1.7–7.7)
Neutrophils Relative %: 83 %
Platelets: 248 10*3/uL (ref 150–400)
RBC: 3.59 MIL/uL — ABNORMAL LOW (ref 3.87–5.11)
RDW: 13 % (ref 11.5–15.5)
WBC: 22.2 10*3/uL — ABNORMAL HIGH (ref 4.0–10.5)
nRBC: 0 % (ref 0.0–0.2)

## 2021-05-20 LAB — COMPREHENSIVE METABOLIC PANEL
ALT: 18 U/L (ref 0–44)
AST: 34 U/L (ref 15–41)
Albumin: 3 g/dL — ABNORMAL LOW (ref 3.5–5.0)
Alkaline Phosphatase: 161 U/L — ABNORMAL HIGH (ref 38–126)
Anion gap: 7 (ref 5–15)
BUN: 5 mg/dL — ABNORMAL LOW (ref 6–20)
CO2: 23 mmol/L (ref 22–32)
Calcium: 9.2 mg/dL (ref 8.9–10.3)
Chloride: 105 mmol/L (ref 98–111)
Creatinine, Ser: 0.69 mg/dL (ref 0.44–1.00)
GFR, Estimated: >60 mL/min (ref >60)
Glucose, Bld: 100 mg/dL — ABNORMAL HIGH (ref 70–99)
Potassium: 4.9 mmol/L (ref 3.5–5.1)
Sodium: 135 mmol/L (ref 135–145)
Total Bilirubin: 0.8 mg/dL (ref 0.3–1.2)
Total Protein: 6.6 g/dL (ref 6.5–8.1)

## 2021-05-20 LAB — RPR: RPR Ser Ql: NONREACTIVE

## 2021-05-20 LAB — CBC
HCT: 35.8 % — ABNORMAL LOW (ref 36.0–46.0)
Hemoglobin: 11.6 g/dL — ABNORMAL LOW (ref 12.0–15.0)
MCH: 31 pg (ref 26.0–34.0)
MCHC: 32.4 g/dL (ref 30.0–36.0)
MCV: 95.7 fL (ref 80.0–100.0)
Platelets: 204 10*3/uL (ref 150–400)
RBC: 3.74 MIL/uL — ABNORMAL LOW (ref 3.87–5.11)
RDW: 13 % (ref 11.5–15.5)
WBC: 14.7 10*3/uL — ABNORMAL HIGH (ref 4.0–10.5)
nRBC: 0 % (ref 0.0–0.2)

## 2021-05-20 LAB — TYPE AND SCREEN
ABO/RH(D): A POS
Antibody Screen: NEGATIVE

## 2021-05-20 MED ORDER — IBUPROFEN 600 MG PO TABS
600.0000 mg | ORAL_TABLET | Freq: Four times a day (QID) | ORAL | Status: DC
  Administered 2021-05-20 – 2021-05-21 (×3): 600 mg via ORAL
  Filled 2021-05-20 (×3): qty 1

## 2021-05-20 MED ORDER — SIMETHICONE 80 MG PO CHEW
80.0000 mg | CHEWABLE_TABLET | ORAL | Status: DC | PRN
  Filled 2021-05-20: qty 1

## 2021-05-20 MED ORDER — LIDOCAINE HCL (PF) 1 % IJ SOLN
30.0000 mL | INTRAMUSCULAR | Status: DC | PRN
Start: 2021-05-20 — End: 2021-05-21
  Administered 2021-05-20: 30 mL via SUBCUTANEOUS
  Filled 2021-05-20: qty 30

## 2021-05-20 MED ORDER — OXYCODONE-ACETAMINOPHEN 5-325 MG PO TABS: 2.0000 | ORAL_TABLET | ORAL | Status: DC | PRN

## 2021-05-20 MED ORDER — DIPHENHYDRAMINE HCL 25 MG PO CAPS: 25.0000 mg | ORAL_CAPSULE | Freq: Four times a day (QID) | ORAL | Status: DC | PRN

## 2021-05-20 MED ORDER — ZOLPIDEM TARTRATE 5 MG PO TABS: 5.0000 mg | ORAL_TABLET | Freq: Every evening | ORAL | Status: DC | PRN

## 2021-05-20 MED ORDER — COCONUT OIL OIL: 1.0000 "application " | TOPICAL_OIL | Status: DC | PRN

## 2021-05-20 MED ORDER — OXYTOCIN-SODIUM CHLORIDE 30-0.9 UT/500ML-% IV SOLN
2.5000 [IU]/h | INTRAVENOUS | Status: DC
  Filled 2021-05-20: qty 500

## 2021-05-20 MED ORDER — FLEET ENEMA 7-19 GM/118ML RE ENEM: 1.0000 | ENEMA | RECTAL | Status: DC | PRN

## 2021-05-20 MED ORDER — PRENATAL MULTIVITAMIN CH
1.0000 | ORAL_TABLET | Freq: Every day | ORAL | Status: DC
  Administered 2021-05-20 – 2021-05-21 (×2): 1 via ORAL
  Filled 2021-05-20 (×2): qty 1

## 2021-05-20 MED ORDER — LACTATED RINGERS IV SOLN: INTRAVENOUS | Status: DC

## 2021-05-20 MED ORDER — OXYCODONE HCL 5 MG PO TABS: 10.0000 mg | ORAL_TABLET | ORAL | Status: DC | PRN

## 2021-05-20 MED ORDER — OXYCODONE HCL 5 MG PO TABS: 5.0000 mg | ORAL_TABLET | ORAL | Status: DC | PRN

## 2021-05-20 MED ORDER — ACETAMINOPHEN 325 MG PO TABS: 650.0000 mg | ORAL_TABLET | ORAL | Status: DC | PRN

## 2021-05-20 MED ORDER — TETANUS-DIPHTH-ACELL PERTUSSIS 5-2.5-18.5 LF-MCG/0.5 IM SUSY: 0.5000 mL | PREFILLED_SYRINGE | Freq: Once | INTRAMUSCULAR | Status: DC

## 2021-05-20 MED ORDER — SENNOSIDES-DOCUSATE SODIUM 8.6-50 MG PO TABS
2.0000 | ORAL_TABLET | ORAL | Status: DC
  Administered 2021-05-20 – 2021-05-21 (×2): 2 via ORAL
  Filled 2021-05-20 (×2): qty 2

## 2021-05-20 MED ORDER — ONDANSETRON HCL 4 MG PO TABS: 4.0000 mg | ORAL_TABLET | ORAL | Status: DC | PRN

## 2021-05-20 MED ORDER — SOD CITRATE-CITRIC ACID 500-334 MG/5ML PO SOLN: 30.0000 mL | ORAL | Status: DC | PRN

## 2021-05-20 MED ORDER — BENZOCAINE-MENTHOL 20-0.5 % EX AERO
1.0000 "application " | INHALATION_SPRAY | CUTANEOUS | Status: DC | PRN
  Administered 2021-05-21: 1 via TOPICAL
  Filled 2021-05-20: qty 56

## 2021-05-20 MED ORDER — LACTATED RINGERS IV SOLN: 500.0000 mL | INTRAVENOUS | Status: DC | PRN

## 2021-05-20 MED ORDER — OXYTOCIN BOLUS FROM INFUSION
333.0000 mL | Freq: Once | INTRAVENOUS | Status: AC
  Administered 2021-05-20: 333 mL via INTRAVENOUS

## 2021-05-20 MED ORDER — OXYCODONE-ACETAMINOPHEN 5-325 MG PO TABS
1.0000 | ORAL_TABLET | ORAL | Status: DC | PRN
Start: 2021-05-20 — End: 2021-05-20

## 2021-05-20 MED ORDER — WITCH HAZEL-GLYCERIN EX PADS: 1.0000 "application " | MEDICATED_PAD | CUTANEOUS | Status: DC | PRN

## 2021-05-20 MED ORDER — DIBUCAINE (PERIANAL) 1 % EX OINT: 1.0000 "application " | TOPICAL_OINTMENT | CUTANEOUS | Status: DC | PRN

## 2021-05-20 MED ORDER — ONDANSETRON HCL 4 MG/2ML IJ SOLN: 4.0000 mg | Freq: Four times a day (QID) | INTRAMUSCULAR | Status: DC | PRN

## 2021-05-20 MED ORDER — ONDANSETRON HCL 4 MG/2ML IJ SOLN: 4.0000 mg | INTRAMUSCULAR | Status: DC | PRN

## 2021-05-20 NOTE — Lactation Note (Signed)
This note was copied from a baby's chart. Lactation Consultation Note  Patient Name: Boy Ruthel Martine [id number] Date: 05/20/2021 Reason for consult: Initial assessment;Term;Breastfeeding assistance Age:28 hours  P2, mother reports that she pumped and breastfed for 2 yrs with her first child.   infant sleeping . When attempt to rouse infant, infant began to gag and spit large amts of  clear mucus 3 different times.  Assist mother with burping infant and using bulb syringe. Mother advised to allow infant to do STS. Informed the benefits of STS.  Mother will page when infant is ready for a feeding attempt. Mother to cue base feed and feed at least 8-12 times or more in 24 hours.    Maternal Data Has patient been taught Hand Expression?: Yes Does the patient have breastfeeding experience prior to this delivery?: Yes How long did the patient breastfeed?: pumping and breastfeeding for 2 yrs  Feeding Mother's Current Feeding Choice: Breast Milk  LATCH Score                    Lactation Tools Discussed/Used    Interventions Interventions: Education  Discharge Pump: Personal (Ameda)  Consult Status Consult Status: Follow-up Date: 05/20/21 Follow-up type: In-patient    Stevan Born [facility] 05/20/2021, 11:18 AM

## 2021-05-20 NOTE — Lactation Note (Addendum)
This note was copied from a baby's chart. Lactation Consultation Note  Patient Name: Misty [name] [id number] Date: 05/20/2021   Age:28 hours  LC went in to see how feeding coming along. Mom talking with MD. Mom to call for latch assistance when ready.   LC returned to help with latching. Infant holding tongue back and with suck training still not able to bring it down. Infant sensitive gag reflex.   Mom set up on DEBP with pumping 15 ml.   Plan 1. To feed infant based on cues 8-12x 24hr period. Mom to try latching in football working to flange out lips and bring down his tongue.  2. If unable to latch, Mom to offer EBM via spoon 5-7 ml per feeding.  3. DEBP q 3hrs for .    Infant not latching to finger at this visit.  LC alerted RN Wynona Neat, parents need assistance with feedings.   Maternal Data    Feeding    LATCH Score                    Lactation Tools Discussed/Used    Interventions    Discharge    Consult Status      Misty Shuster  [name] 05/20/2021, 8:20 PM

## 2021-05-20 NOTE — H&P (Addendum)
Misty [name] is a 30 y.o. G2P0101 at [redacted]w[redacted]d presenting for active labor. Pt notes onset contractions around 8 pm, worsening through night . Good fetal movement, No vaginal bleeding, started leaking fluid once on L&D, about 2 am.  [facility] at [facility] since first trimester wks - h/o PTD, BMZ given for ctx at 32 wks GBS neg    Prenatal Transfer Tool  Maternal Diabetes: No Genetic Screening: Declined Maternal Ultrasounds/Referrals: Normal Fetal Ultrasounds or other Referrals:  None Maternal Substance Abuse:  No Significant Maternal Medications:  None Significant Maternal Lab Results: Group B Strep negative     OB History     Gravida  2   Para  1   Term      Preterm  1   AB      Living  1      SAB      IAB      Ectopic      Multiple      Live Births  1          Past Medical History:  Diagnosis Date   Aortic insufficiency    Echo 5/14: EF 60-65, trace MR/AI // Echo 12/17 (in [place]):  EF 60-65, no RWMA, normal diastolic function, trace/mild AI, trace TR // Echo 6/19: EF 55-60, normal diastolic function, trivial AI    History of exercise stress test    GXT 5/14:  No ischemic changes   Migraines    Palpitations    Holter 1/18:  NSR, HR 51-152   Preterm labor    SOB (shortness of breath)    Past Surgical History:  Procedure Laterality Date   NO PAST SURGERIES     Family History: family history includes CVA in her paternal grandfather; Heart disease in her maternal grandmother; Hypertension in her father and maternal grandfather; Thyroid disease in her mother. Social History:  reports that she has quit smoking. She has never used smokeless tobacco. She reports current alcohol use. She reports that she does not use drugs.  Review of Systems - Negative except painful contractions, pelvic pressure   Dilation: Lip/rim Effacement (%): 90 Station: -1 Exam by:: Dr. Ernestina Penna Blood pressure 128/84, pulse 69, resp. rate 18, height 5\' 5"  (1.651 m), weight 63  kg, unknown if currently breastfeeding.  Physical Exam:  Gen: well appearing, no distress, breathing heavy and moaning with contractions Abd: gravid, NT, no RUQ pain LE: trace edema, equal bilaterally, non-tender Toco: q43min FH: baseline 130s, accelerations present, no deceleratons, 10 beat variability  Prenatal labs: ABO, Rh: --/--/A POS (10/30 02-25-1991) Antibody: NEG (10/30 0058) Rubella:   RPR: Nonreactive (10/04 0000)  HBsAg:    HIV:    GBS:   neg 1 hr Glucola normal  Genetic screening declined Anatomy 01-21-2005 normal   Assessment/Plan: 30 y.o. G2P0101 at [redacted]w[redacted]d Active labor. Prep for delivery GBS neg Reactive fetal testing.    [redacted]w[redacted]d 05/20/2021, 2:31 AM  Lab update RI HIV neg Hep B neg RPR NR DS 73  Ronnett Pullin A Truitt Cruey 05/20/2021 4:20 AM

## 2021-05-20 NOTE — Progress Notes (Signed)
Orthostatic vital signs done. Lying B/P 114/77, HR 82; sitting B/P 125/80, HR 98; standing B/P 104/86, HR 170. Assisted to the bathroom and when back to bed sitting B/P 122/83, HR 108. Patient stated she felt fine sitting on the side of bed unlike this AM but when standing she could feel her heart racing. Notified Dr. Ernestina Penna. Will continue to monitor patient and patient to get up only with assistance.

## 2021-05-20 NOTE — Progress Notes (Signed)
CTSP for c/o heart racing. Pt notes overtired and hasn't slept in >24 hrs. Pt notes heart skipping beats, aboue every 15 seconds, not very bothersome but wanted [place] to be aware. No cough, no chest pain, no HA, no swelling. Pt notes h/o 'aortic insufficiency' on disgnosis as a teenager but no issues through last pregnancy or this preg. Later in the preg started to have occ skipped beats and saw cards at 36 wks. Pt had EKG and told rare PVCs.  Vitals:   05/20/21 0615 05/20/21 0700  BP: 114/75 104/64  Pulse: 78 79  Resp: 18 20  Temp: 97.9 F (36.6 C) 98 F (36.7 C)  SpO2: 98% 99%   Gen: appears comfotable in bed, no distress CV: regular rate, skipped beat about every 15 beats, no murmur Pulm: CTAB LE: no edema  CBC    Component Value Date/Time   WBC 14.7 (H) 05/20/2021 0057   RBC 3.74 (L) 05/20/2021 0057   HGB 11.6 (L) 05/20/2021 0057   HCT 35.8 (L) 05/20/2021 0057   PLT 204 05/20/2021 0057   MCV 95.7 05/20/2021 0057   MCH 31.0 05/20/2021 0057   MCHC 32.4 05/20/2021 0057   RDW 13.0 05/20/2021 0057   LYMPHSABS 2.0 12/03/2017 0608   MONOABS 1.4 (H) 12/03/2017 0608   EOSABS 0.1 12/03/2017 0608   BASOSABS 0.0 12/03/2017 0608    PPD#1, active labor, normal term delivery now feeling occasional heart palp, skipped beat confirmed on ausculation but no evidence of compromise. Significant orthostatic change in pulse by EKG. Pt comfortable at rest. Awaiting input from cardiology on EKG. Will allow pt to eat/ rest then repeat orthostatic bp and pulse.    Misty [name] 05/20/2021 9:45 AM

## 2021-05-20 NOTE — Progress Notes (Signed)
Saw that patient was progressing rather quickly and in her prenatals there was no record of HIV status or Hep B. I touched base with the  L&D nurse asking her to ask the attending whenever she spoke to her regarding the patient to also ask about those prenatal labs. I never heard back from her. Once the baby delivered I saw that it was left blank in the H&P. I called Dr. Ernestina Penna and left her a message asking her to call me back regarding those labs. She called back and updated the labs. I went off of that update.

## 2021-05-20 NOTE — Lactation Note (Signed)
This note was copied from a baby's chart. Lactation Consultation Note  Patient Name: Misty [name] Date: 05/20/2021 Reason for consult: Initial assessment Age:28 hours P2 , mother still attempting to latch infant. Infant remains spitty and gagging.  Several attempts to latch infant . Infant showing no feeding cues.  Infant was fed 4 ml of ebm with a spoon.   Mother encouraged to continue to hand express after each feeding attempt and supplement infant with a spoon.  Another attempt to breastfeed after spoon feeding and infant took a few sucks. . Mother was set up with a DEBP by STAFF nurse Devin . She was unable to pump at this time. Mother will call when she is ready to pump  and be fit with proper flange size.  Encouraged to continue to do frequent STS.   Maternal Data    Feeding Mother's Current Feeding Choice: Breast Milk  LATCH Score Latch: Too sleepy or reluctant, no latch achieved, no sucking elicited. (few sucks)  Audible Swallowing: None  Type of Nipple: Everted at rest and after stimulation  Comfort (Breast/Nipple): Soft / non-tender  Hold (Positioning): Full assist, staff holds infant at breast  LATCH Score: 4   Lactation Tools Discussed/Used    Interventions    Discharge    Consult Status Consult Status: Follow-up Date: 05/20/21 Follow-up type: In-patient    Misty [name] 05/20/2021, 4:13 PM

## 2021-05-20 NOTE — MAU Note (Signed)
Ctx since earlier tonight. 3 min apart now . Denies any vag bleeding or leaking . Good fetal movmement felt. 3cm on last exam.

## 2021-05-21 LAB — CBC WITH DIFFERENTIAL/PLATELET
Abs Immature Granulocytes: 0.08 10*3/uL — ABNORMAL HIGH (ref 0.00–0.07)
Basophils Absolute: 0.1 10*3/uL (ref 0.0–0.1)
Basophils Relative: 0 %
Eosinophils Absolute: 0.1 10*3/uL (ref 0.0–0.5)
Eosinophils Relative: 1 %
HCT: 32.6 % — ABNORMAL LOW (ref 36.0–46.0)
Hemoglobin: 11 g/dL — ABNORMAL LOW (ref 12.0–15.0)
Immature Granulocytes: 1 %
Lymphocytes Relative: 21 %
Lymphs Abs: 2.9 10*3/uL (ref 0.7–4.0)
MCH: 32.1 pg (ref 26.0–34.0)
MCHC: 33.7 g/dL (ref 30.0–36.0)
MCV: 95 fL (ref 80.0–100.0)
Monocytes Absolute: 0.7 10*3/uL (ref 0.1–1.0)
Monocytes Relative: 5 %
Neutro Abs: 10.3 10*3/uL — ABNORMAL HIGH (ref 1.7–7.7)
Neutrophils Relative %: 72 %
Platelets: 232 10*3/uL (ref 150–400)
RBC: 3.43 MIL/uL — ABNORMAL LOW (ref 3.87–5.11)
RDW: 13.2 % (ref 11.5–15.5)
WBC: 14.1 10*3/uL — ABNORMAL HIGH (ref 4.0–10.5)
nRBC: 0 % (ref 0.0–0.2)

## 2021-05-21 MED ORDER — COCONUT OIL OIL: 1.0000 "application " | TOPICAL_OIL | 0 refills | Status: DC | PRN

## 2021-05-21 MED ORDER — IBUPROFEN 600 MG PO TABS: 600.0000 mg | ORAL_TABLET | Freq: Four times a day (QID) | ORAL | 0 refills | Status: DC

## 2021-05-21 MED ORDER — BENZOCAINE-MENTHOL 20-0.5 % EX AERO: 1.0000 "application " | INHALATION_SPRAY | CUTANEOUS | Status: DC | PRN

## 2021-05-21 MED ORDER — SENNOSIDES-DOCUSATE SODIUM 8.6-50 MG PO TABS: 2.0000 | ORAL_TABLET | Freq: Every evening | ORAL | 0 refills | Status: DC | PRN

## 2021-05-21 MED ORDER — ACETAMINOPHEN 325 MG PO TABS: 650.0000 mg | ORAL_TABLET | ORAL | Status: DC | PRN

## 2021-05-21 NOTE — Discharge Summary (Signed)
Postpartum Discharge Summary  Date of Service updated 05/21/2021     Patient Name: Misty [name] DOB: 07-13-1991 MRN: [id number]  Date of admission: 05/20/2021 Delivery date:05/20/2021  Delivering provider: Aloha Gell  Date of discharge: 05/21/2021  Admitting diagnosis: Normal labor [O80, Z37.9] Intrauterine pregnancy: [redacted]w[redacted]d    Secondary diagnosis:  Principal Problem:   Postpartum care following vaginal delivery 10/30 Active Problems:   Normal labor   Second degree perineal laceration during delivery   SVD (spontaneous vaginal delivery)  Additional problems: Orthostatic tachycardia - improving; Leukocytosis improving     Discharge diagnosis: Term Pregnancy Delivered                                              Post partum procedures: n/a Augmentation: N/A Complications: None  Hospital course: Onset of Labor With Vaginal Delivery      30y.o. yo [id number] admitted in Active Labor on 05/20/2021. Patient had an uncomplicated labor course as follows:  Membrane Rupture Time/Date: 2:04 AM ,05/20/2021   Delivery Method:Vaginal, Spontaneous  Episiotomy: None  Lacerations:   2nd degree perineal  Patient had an uncomplicated postpartum course.  She did have orthostatic tachycardia, which seems to be resolving. Pt. States she is comfortable at rest and denies chest pain/SOB when she is ambulating. She reports her HR is improving with ambulation. She plans to f/u with cardiology if symptoms persist. She is ambulating, tolerating a regular diet, passing flatus, and urinating well. Patient is discharged home in stable condition on 05/21/21.  Newborn Data: Birth date:05/20/2021  Birth time:4:00 AM  Gender:Female  Living status:Living  Apgars:8 ,9  [phone]  Circ completed this morning  "[place]"  Magnesium Sulfate received: No BMZ received: No Rhophylac:N/A MMR:N/A T-DaP: declined Flu: No declined Transfusion:No  Physical exam  Vitals:   05/20/21 1117  05/20/21 1507 05/20/21 1517 05/20/21 1940  BP: 113/72  122/83 115/79  Pulse: 72  (!) 108 83  Resp: 18   20  Temp: 98.4 F (36.9 C) 98.6 F (37 C)  98.2 F (36.8 C)  TempSrc: Oral Oral  Oral  SpO2: 98% 99%  100%  Weight:      Height:       General: alert, cooperative, and no distress Heart: RRR Lungs: clear, equal bilaterally  Lochia: appropriate Uterine Fundus: firm, below umbilicus  Perineum: well approximated 2nd degree perineal repair, mild ecchymosis, no hematoma, no edema DVT Evaluation: No evidence of DVT seen on physical exam. No cords or calf tenderness. No significant calf/ankle edema. Labs: Lab Results  Component Value Date   WBC 14.1 (H) 05/21/2021   HGB 11.0 (L) 05/21/2021   HCT 32.6 (L) 05/21/2021   MCV 95.0 05/21/2021   PLT 232 05/21/2021   CMP Latest Ref Rng & Units 05/20/2021  Glucose 70 - 99 mg/dL 100(H)  BUN 6 - 20 mg/dL 5(L)  Creatinine 0.44 - 1.00 mg/dL 0.69  Sodium 135 - 145 mmol/L 135  Potassium 3.5 - 5.1 mmol/L 4.9  Chloride 98 - 111 mmol/L 105  CO2 22 - 32 mmol/L 23  Calcium 8.9 - 10.3 mg/dL 9.2  Total Protein 6.5 - 8.1 g/dL 6.6  Total Bilirubin 0.3 - 1.2 mg/dL 0.8  Alkaline Phos 38 - 126 U/L 161(H)  AST 15 - 41 U/L 34  ALT 0 - 44 U/L 18   [place]  Score: Edinburgh Postnatal Depression Scale Screening Tool 05/21/2021  I have been able to laugh and see the funny side of things. 0  I have looked forward with enjoyment to things. 0  I have blamed myself unnecessarily when things went wrong. 0  I have been anxious or worried for no good reason. 0  I have felt scared or panicky for no good reason. 0  Things have been getting on top of me. 0  I have been so unhappy that I have had difficulty sleeping. 0  I have felt sad or miserable. 0  I have been so unhappy that I have been crying. 0  The thought of harming myself has occurred to me. 0  Edinburgh Postnatal Depression Scale Total 0      After visit meds:  Allergies as of 05/21/2021        Reactions   Fish Allergy Swelling   Other Other (See Comments)        Medication List     TAKE these medications    acetaminophen 325 MG tablet Commonly known as: Tylenol Take 2 tablets (650 mg total) by mouth every 4 (four) hours as needed (for pain scale < 4).   benzocaine-Menthol 20-0.5 % Aero Commonly known as: DERMOPLAST Apply 1 application topically as needed for irritation (perineal discomfort).   coconut oil Oil Apply 1 application topically as needed.   ibuprofen 600 MG tablet Commonly known as: ADVIL Take 1 tablet (600 mg total) by mouth every 6 (six) hours.   PRE-NATAL PO Take 2 tablets by mouth daily.   senna-docusate 8.6-50 MG tablet Commonly known as: Senokot-S Take 2 tablets by mouth at bedtime as needed for mild constipation.               Discharge Care Instructions  (From admission, onward)           Start     Ordered   05/21/21 0000  Discharge wound care:       Comments: Warm water sitz baths 2-3 times per day as needed   05/21/21 1130             Discharge home in stable condition Infant Feeding: Breast Infant Disposition:home with mother pending peds assessment Discharge instruction: per After Visit Summary and Postpartum booklet. Activity: Advance as tolerated. Pelvic rest for 6 weeks.  Diet: low salt diet Anticipated Birth Control: Unsure Postpartum Appointment:6 weeks Additional Postpartum F/U: Postpartum Depression checkup Future Appointments:No future appointments. Follow up Visit:  Follow-up Information     Brien Few, MD Follow up in 6 week(s).   Specialty: Obstetrics and Gynecology Why: Postpartum visit Contact information: [place] [address] [phone]                     05/21/2021 Darliss Cheney, CNM

## 2021-05-21 NOTE — Lactation Note (Signed)
This note was copied from a baby's chart. Lactation Consultation Note  Patient Name: Misty [name] [id number] Date: 05/21/2021 Reason for consult: Follow-up assessment Age:28 hours Mother reports that she just gave infant a bottle with 27 ml of ebm.  Mother reports that she has a Ameda pump and plans to pump after breastfeeding.  Discussed tx and prevention of engorgement.  Mother reports that she had Mastitis with last child.  Reviewed S/S of Mastitis and advised limited activity and call MD if S/S.   Maternal Data    Feeding Mother's Current Feeding Choice: Breast Milk  LATCH Score Latch: Grasps breast easily, tongue down, lips flanged, rhythmical sucking.  Audible Swallowing: A few with stimulation  Type of Nipple: Everted at rest and after stimulation  Comfort (Breast/Nipple): Soft / non-tender  Hold (Positioning): Assistance needed to correctly position infant at breast and maintain latch.  LATCH Score: 8   Lactation Tools Discussed/Used Flange Size: 21 Breast pump type: Manual  Interventions Interventions: Hand express;Pre-pump if needed;Hand pump;DEBP  Discharge Discharge Education: Engorgement and breast care;Warning signs for feeding baby;Outpatient recommendation  Consult Status Consult Status: Complete    Michel Bickers 05/21/2021, 3:29 PM

## 2021-05-21 NOTE — Discharge Instructions (Signed)
Sitz baths 2 times /day with warm water x 1 week May add herbals: 1 ounce dried comfrey leaf* 1 ounce calendula flowers 1 ounce lavender flowers 1/2 ounce dried uva ursi leaves 1/2 ounce witch hazel blossoms (if you can find them) 1/2 ounce dried sage leaf 1/2 cup sea salt Directions: Bring 2 quarts of water to a boil. Turn off heat, and place 1 ounce (approximately 1 large handful) of the above mixed herbs (not the salt) into the pot. Steep, covered, for 30 minutes.  Strain the liquid well with a fine mesh strainer, and discard the herb material. Add 2 quarts of liquid to the tub, along with the 1/2 cup of salt. This medicinal liquid can also be made into compresses and peri-rinses. 

## 2021-05-22 ENCOUNTER — Inpatient Hospital Stay (HOSPITAL_COMMUNITY): Payer: BC Managed Care – PPO

## 2021-05-22 ENCOUNTER — Inpatient Hospital Stay (HOSPITAL_COMMUNITY)
Admission: AD | Admit: 2021-05-22 | Payer: BC Managed Care – PPO | Source: Home / Self Care | Admitting: Obstetrics and Gynecology

## 2021-06-01 ENCOUNTER — Telehealth (HOSPITAL_COMMUNITY): Payer: Self-pay | Admitting: *Deleted

## 2021-06-01 NOTE — Telephone Encounter (Signed)
Left message to return nurse call.  Duffy Rhody, RN 06-01-2021 at 12:11pm

## 2021-07-14 ENCOUNTER — Emergency Department (HOSPITAL_COMMUNITY): Payer: BC Managed Care – PPO

## 2021-07-14 ENCOUNTER — Emergency Department (HOSPITAL_COMMUNITY)
Admission: EM | Admit: 2021-07-14 | Discharge: 2021-07-14 | Disposition: A | Discharge status: Home / Self Care | Payer: BC Managed Care – PPO | Attending: Emergency Medicine | Admitting: Emergency Medicine

## 2021-07-14 ENCOUNTER — Other Ambulatory Visit: Payer: Self-pay

## 2021-07-14 ENCOUNTER — Encounter (HOSPITAL_COMMUNITY): Payer: Self-pay | Admitting: Emergency Medicine

## 2021-07-14 DIAGNOSIS — Z87891 Personal history of nicotine dependence: Secondary | ICD-10-CM | POA: Insufficient documentation

## 2021-07-14 DIAGNOSIS — O99893 Other specified diseases and conditions complicating puerperium: Secondary | ICD-10-CM | POA: Diagnosis not present

## 2021-07-14 DIAGNOSIS — R1011 Right upper quadrant pain: Secondary | ICD-10-CM | POA: Insufficient documentation

## 2021-07-14 DIAGNOSIS — R109 Unspecified abdominal pain: Secondary | ICD-10-CM

## 2021-07-14 LAB — URINALYSIS, ROUTINE W REFLEX MICROSCOPIC
Bilirubin Urine: NEGATIVE
Glucose, UA: NEGATIVE mg/dL
Hgb urine dipstick: NEGATIVE
Ketones, ur: NEGATIVE mg/dL
Leukocytes,Ua: NEGATIVE
Nitrite: NEGATIVE
Protein, ur: NEGATIVE mg/dL
Specific Gravity, Urine: 1.01 (ref 1.005–1.030)
pH: 7.5 (ref 5.0–8.0)

## 2021-07-14 LAB — CBC WITH DIFFERENTIAL/PLATELET
Abs Immature Granulocytes: 0.02 10*3/uL (ref 0.00–0.07)
Basophils Absolute: 0.1 10*3/uL (ref 0.0–0.1)
Basophils Relative: 1 %
Eosinophils Absolute: 1 10*3/uL — ABNORMAL HIGH (ref 0.0–0.5)
Eosinophils Relative: 13 %
HCT: 38.2 % (ref 36.0–46.0)
Hemoglobin: 12.2 g/dL (ref 12.0–15.0)
Immature Granulocytes: 0 %
Lymphocytes Relative: 25 %
Lymphs Abs: 2 10*3/uL (ref 0.7–4.0)
MCH: 30.4 pg (ref 26.0–34.0)
MCHC: 31.9 g/dL (ref 30.0–36.0)
MCV: 95.3 fL (ref 80.0–100.0)
Monocytes Absolute: 0.3 10*3/uL (ref 0.1–1.0)
Monocytes Relative: 3 %
Neutro Abs: 4.4 10*3/uL (ref 1.7–7.7)
Neutrophils Relative %: 58 %
Platelets: 270 10*3/uL (ref 150–400)
RBC: 4.01 MIL/uL (ref 3.87–5.11)
RDW: 12 % (ref 11.5–15.5)
WBC: 7.8 10*3/uL (ref 4.0–10.5)
nRBC: 0 % (ref 0.0–0.2)

## 2021-07-14 LAB — COMPREHENSIVE METABOLIC PANEL
ALT: 19 U/L (ref 0–44)
AST: 20 U/L (ref 15–41)
Albumin: 4.2 g/dL (ref 3.5–5.0)
Alkaline Phosphatase: 56 U/L (ref 38–126)
Anion gap: 8 (ref 5–15)
BUN: 12 mg/dL (ref 6–20)
CO2: 26 mmol/L (ref 22–32)
Calcium: 9.3 mg/dL (ref 8.9–10.3)
Chloride: 107 mmol/L (ref 98–111)
Creatinine, Ser: 0.71 mg/dL (ref 0.44–1.00)
GFR, Estimated: >60 mL/min (ref >60)
Glucose, Bld: 93 mg/dL (ref 70–99)
Potassium: 4.5 mmol/L (ref 3.5–5.1)
Sodium: 141 mmol/L (ref 135–145)
Total Bilirubin: 0.9 mg/dL (ref 0.3–1.2)
Total Protein: 7.3 g/dL (ref 6.5–8.1)

## 2021-07-14 LAB — I-STAT BETA HCG BLOOD, ED (MC, WL, AP ONLY): I-stat hCG, quantitative: <5 m[IU]/mL (ref <5)

## 2021-07-14 LAB — LIPASE, BLOOD: Lipase: 36 U/L (ref 11–51)

## 2021-07-14 MED ORDER — IOHEXOL 300 MG/ML  SOLN
100.0000 mL | Freq: Once | INTRAMUSCULAR | Status: AC | PRN
  Administered 2021-07-14: 12:00:00 100 mL via INTRAVENOUS

## 2021-07-14 NOTE — ED Provider Notes (Signed)
Emergency Medicine Provider Triage Evaluation Note  Misty [name] , a 30 y.o. female  was evaluated in triage.  Pt complains of abdominal pain. States that same began yesterday afternoon and is sharp in nature and located on the right side. Does endorse that she has been more constipated recently with only passage of hard pebble stools over the past day or so. No history of abdominal surgeries. No n/v/d, fevers, chills, or worsening pain after meals. Does endorse more pain with movement. No recent exercise or heavy lifting. Of note, patient does state that she had a normal vaginal delivery 7 weeks ago.  Review of Systems  Positive: R sided abdominal pain, constipation Negative: Fever, chills, n/v/d  Physical Exam  BP 120/83 (BP Location: Left Arm)    Pulse 98    Temp 98.1 F (36.7 C)    Resp 16    SpO2 98%  Gen:   Awake, no distress   Resp:  Normal effort  MSK:   Moves extremities without difficulty  Other:  Mild right sided tenderness to palpation of the abdomen, peritoneal signs negative  Medical Decision Making  Medically screening exam initiated at 9:44 AM.  Appropriate orders placed.  Rhona Fusilier was informed that the remainder of the evaluation will be completed by another provider, this initial triage assessment does not replace that evaluation, and the importance of remaining in the ED until their evaluation is complete.     Vear Clock 07/14/21 [id number]    Linwood Dibbles, MD 07/15/21 [phone]

## 2021-07-14 NOTE — ED Provider Notes (Signed)
[facility] EMERGENCY DEPARTMENT Provider Note   CSN: [id number] Arrival date & time: 07/14/21  2694     History Chief Complaint  Patient presents with   Abdominal Pain    Misty [name] is a 30 y.o. female who is status post vaginal delivery 7 weeks ago who presents emergency department with chief complaint of right upper quadrant abdominal pain.  She had onset of pain beginning last night.  She states it has progressively worsened.  She has persistent pain localized to the right upper quadrant.  She denies nausea, vomiting, fever, chills, history of abdominal surgery.  The history is provided by the patient. No language interpreter was used.  Abdominal Pain     Past Medical History:  Diagnosis Date   Aortic insufficiency    Echo 5/14: EF 60-65, trace MR/AI // Echo 12/17 (in [place]):  EF 60-65, no RWMA, normal diastolic function, trace/mild AI, trace TR // Echo 6/19: EF 55-60, normal diastolic function, trivial AI    History of exercise stress test    GXT 5/14:  No ischemic changes   Migraines    Palpitations    Holter 1/18:  NSR, HR 51-152   Preterm labor    SOB (shortness of breath)     Patient Active Problem List   Diagnosis Date Noted   Second degree perineal laceration during delivery 05/21/2021   SVD (spontaneous vaginal delivery) 05/21/2021   Postpartum care following vaginal delivery 10/30 05/21/2021   Normal labor 05/20/2021   Preterm labor in third trimester with preterm delivery 12/03/2017   Preterm labor with preterm delivery 12/02/2017   Preterm labor in third trimester 11/24/2017   Palpitations 07/29/2013    Past Surgical History:  Procedure Laterality Date   NO PAST SURGERIES       OB History     Gravida  2   Para  2   Term  1   Preterm  1   AB      Living  2      SAB      IAB      Ectopic      Multiple  0   Live Births  2           Family History  Problem Relation Age of Onset   Thyroid disease Mother     Hypertension Father    Heart disease Maternal Grandmother    Hypertension Maternal Grandfather    CVA Paternal Grandfather     Social History   Tobacco Use   Smoking status: Former   Smokeless tobacco: Never  Building services engineer Use: Never used  Substance Use Topics   Alcohol use: Yes    Alcohol/week: 0.0 standard drinks    Comment: occasional   Drug use: No    Home Medications Prior to Admission medications   Medication Sig Start Date End Date Taking? Authorizing Provider  acetaminophen (TYLENOL) 325 MG tablet Take 2 tablets (650 mg total) by mouth every 4 (four) hours as needed (for pain scale < 4). 05/21/21   Sigmon, Meredith C, CNM  benzocaine-Menthol (DERMOPLAST) 20-0.5 % AERO Apply 1 application topically as needed for irritation (perineal discomfort). 05/21/21   Sigmon, Scarlette Slice, CNM  coconut oil OIL Apply 1 application topically as needed. 05/21/21   Sigmon, Scarlette Slice, CNM  ibuprofen (ADVIL) 600 MG tablet Take 1 tablet (600 mg total) by mouth every 6 (six) hours. 05/21/21   Karena Addison, CNM  Prenatal Multivit-Min-Fe-FA (PRE-NATAL  PO) Take 2 tablets by mouth daily.    [provider]  senna-docusate (SENOKOT-S) 8.6-50 MG tablet Take 2 tablets by mouth at bedtime as needed for mild constipation. 05/21/21   Karena Addison, CNM    Allergies    Fish allergy and Other  Review of Systems   Review of Systems  Gastrointestinal:  Positive for abdominal pain.  Ten systems reviewed and are negative for acute change, except as noted in the HPI.   Physical Exam Updated Vital Signs BP 120/83 (BP Location: Left Arm)    Pulse 98    Temp 98.1 F (36.7 C)    Resp 16    SpO2 98%   Physical Exam Vitals and nursing note reviewed.  Constitutional:      General: She is not in acute distress.    Appearance: She is well-developed. She is not diaphoretic.  HENT:     Head: Normocephalic and atraumatic.     Right Ear: External ear normal.     Left Ear:  External ear normal.     Nose: Nose normal.     Mouth/Throat:     Mouth: Mucous membranes are moist.  Eyes:     General: No scleral icterus.    Conjunctiva/sclera: Conjunctivae normal.  Cardiovascular:     Rate and Rhythm: Normal rate and regular rhythm.     Heart sounds: Normal heart sounds. No murmur heard.   No friction rub. No gallop.  Pulmonary:     Effort: Pulmonary effort is normal. No respiratory distress.     Breath sounds: Normal breath sounds.  Abdominal:     General: Bowel sounds are normal. There is no distension.     Palpations: Abdomen is soft. There is no mass.     Tenderness: There is abdominal tenderness in the right lower quadrant. There is guarding.     Comments: Patient abdomen is soft, normal bowel sounds, linea nigra present.  No right upper quadrant abdominal tenderness, point tenderness and guarding in the right lower quadrant of the abdomen.  No rebound  Musculoskeletal:     Cervical back: Normal range of motion.  Skin:    General: Skin is warm and dry.  Neurological:     Mental Status: She is alert and oriented to person, place, and time.  Psychiatric:        Behavior: Behavior normal.    ED Results / Procedures / Treatments   Labs (all labs ordered are listed, but only abnormal results are displayed) Labs Reviewed  CBC WITH DIFFERENTIAL/PLATELET - Abnormal; Notable for the following components:      Result Value   Eosinophils Absolute 1.0 (*)    All other components within normal limits  COMPREHENSIVE METABOLIC PANEL  LIPASE, BLOOD  URINALYSIS, ROUTINE W REFLEX MICROSCOPIC  I-STAT BETA HCG BLOOD, ED ([facility], [facility], [facility] ONLY)    EKG None  Radiology DG Abd 1 View  Result Date: 07/14/2021 CLINICAL DATA:  Abdominal pain EXAM: ABDOMEN - 1 VIEW COMPARISON:  None. FINDINGS: Bowel gas pattern is nonspecific. Moderate amount of stool is seen in the colon. There is no evidence of fecal impaction in the rectum. There are small calcific densities overlying  the midportion of right kidney which may suggest calcification in the costal cartilage or small renal stones. Bony structures are unremarkable. IMPRESSION: Bowel gas pattern is nonspecific. Moderate amount of stool is seen in colon without signs of fecal impaction in the rectum. There are few small ill-defined calcific densities each measuring  less than 4 mm in size overlying the midportion of right kidney which may suggest an artifact such as costal cartilage calcifications or small renal stones. Electronically Signed   By: Ernie Avena M.D.   On: 07/14/2021 10:46    Procedures Procedures   Medications Ordered in ED Medications - No data to display  ED Course  I have reviewed the triage vital signs and the nursing notes.  Pertinent labs & imaging results that were available during my care of the patient were reviewed by me and considered in my medical decision making (see chart for details).    MDM Rules/Calculators/A&P                         30 year old female status post vaginal delivery 7 weeks ago presents emergency with abdominal pain.  Patient states that her pain is in the right upper quadrant.  On physical examination she is tender in the right lower quadrant.The emergent DDX for RUQ pain includes but is not limited to Glabladder disease, PUD, Acute Hepatitis, Pancreatitis, pyelonephritis, Pneumonia, Lower lobe PE/Infarct, Kidney stone, GERD, retrocecal appendicitis, Fitz-Hugh-Curtis syndrome, AAA, MI, Zoster. I ordered and reviewed labs that include CBC which shows mild eosinophilia without evidence of any other abnormality, negative pregnancy, negative lipase, CMP all within normal limits, urine negative for infection I ordered and reviewed a CT abdomen and pelvis which shows no acute abnormalities, plain film of the abdomen negative ordered at triage. Patient is otherwise normotensive without fever, on reevaluation she has no significant abdominal tenderness.  Patient feels  comfortable with discharge.  Tylenol for pain, outpatient follow-up, discussed return precautions.  Final Clinical Impression(s) / ED Diagnoses Final diagnoses:  None    Rx / DC Orders ED Discharge Orders     None        Arthor Captain, PA-C 07/14/21 1355    Linwood Dibbles, MD 07/15/21 [phone]

## 2021-07-14 NOTE — ED Triage Notes (Signed)
C/o RUQ pain since yesterday that is worse with movement.  Denies nausea, vomiting, and diarrhea.

## 2021-07-14 NOTE — Discharge Instructions (Signed)

## 2021-07-14 NOTE — ED Notes (Signed)
Hooked up patient on the monitor patient is resting with call bell in reach

## 2021-07-31 ENCOUNTER — Ambulatory Visit: Payer: BC Managed Care – PPO | Admitting: Internal Medicine

## 2021-09-05 ENCOUNTER — Ambulatory Visit: Payer: BC Managed Care – PPO | Admitting: Allergy

## 2021-09-05 ENCOUNTER — Encounter: Payer: Self-pay | Admitting: Allergy

## 2021-09-05 ENCOUNTER — Other Ambulatory Visit: Payer: Self-pay

## 2021-09-05 VITALS — BP 108/70 | HR 86 | Temp 98.2°F | Resp 18 | Ht 65.25 in | Wt 115.0 lb

## 2021-09-05 DIAGNOSIS — J310 Chronic rhinitis: Secondary | ICD-10-CM | POA: Diagnosis not present

## 2021-09-05 DIAGNOSIS — T7800XA Anaphylactic reaction due to unspecified food, initial encounter: Secondary | ICD-10-CM

## 2021-09-05 DIAGNOSIS — H109 Unspecified conjunctivitis: Secondary | ICD-10-CM | POA: Diagnosis not present

## 2021-09-05 MED ORDER — EPINEPHRINE 0.3 MG/0.3ML IJ SOAJ: 0.3000 mg | Freq: Once | INTRAMUSCULAR | 2 refills | Status: AC

## 2021-09-05 NOTE — Progress Notes (Signed)
New Patient Note  RE: Misty [name] MRN: [id number] DOB: 02-Aug-1990 Date of Office Visit: 09/05/2021  Referring provider: [name], MD Primary care provider: [name], MD  Chief Complaint: Food allergy  History of present illness: Misty [name] is a 31 y.o. female presenting today for consultation for allergic reaction to food.    She reports reaction to seafood.  She reports her throat closed and she had trouble breathing.  This occurred about 7 years ago.  These symptoms occurred after eating dish containing shrimp and salmon.  She states she was eating a restaurant when symptoms occurred.  She left restaurant and went to a store to get benadryl and took this which did help. She states prior to this episode she would eat seafood multiple times a month without issue.  She has been avoiding seafood since and would like eat it again if able.  She has never had an epinephrine device.     She states around 31yo she recalls having constant mucus in her throat that she did have allergy testing for and was prescribed inhalers and states this issue went away in time.  She has not used inhalers since.  She has no history of asthma.  She was never on allergen immunotherapy.    She does report in spring she has nasal congestion, itchy eyes and headaches.  She will use nasal saline spray occasionally.    No history of eczema.    Review of systems: Review of Systems  Constitutional: Negative.   HENT: Negative.    Eyes: Negative.   Respiratory: Negative.    Cardiovascular: Negative.   Gastrointestinal: Negative.   Musculoskeletal: Negative.   Skin: Negative.   Allergic/Immunologic: Negative.   Neurological: Negative.    All other systems negative unless noted above in HPI  Past medical history: Past Medical History:  Diagnosis Date   Aortic insufficiency    Echo 5/14: EF 60-65, trace MR/AI // Echo 12/17 (in [place]):  EF 60-65, no RWMA, normal diastolic function, trace/mild  AI, trace TR // Echo 6/19: EF 55-60, normal diastolic function, trivial AI    History of exercise stress test    GXT 5/14:  No ischemic changes   Migraines    Palpitations    Holter 1/18:  NSR, HR 51-152   Preterm labor    SOB (shortness of breath)     Past surgical history: Past Surgical History:  Procedure Laterality Date   NO PAST SURGERIES      Family history:  Family History  Problem Relation Age of Onset   Thyroid disease Mother    Hypertension Father    Eczema Sister    Allergic rhinitis Sister    Allergic rhinitis Maternal Grandmother    Heart disease Maternal Grandmother    Hypertension Maternal Grandfather    CVA Paternal Grandfather    Asthma Neg Hx    Atopy Neg Hx    Immunodeficiency Neg Hx    Angioedema Neg Hx    Urticaria Neg Hx     Social history: Lives in a home without carpeting with electric and wood heating and central cooling.  3 dogs in the home.  1 cat and chickens outside the home.  There is no concern for water damage, mildew or roaches in the home.  She is a Furniture conservator/restorer.  She denies a smoking history.   Medication List: Current Outpatient Medications  Medication Sig Dispense Refill   EPINEPHrine (EPIPEN 2-PAK) 0.3 mg/0.3 mL IJ SOAJ injection  Inject 0.3 mg into the muscle once for 1 dose. 2 each 2   Prenatal Multivit-Min-Fe-FA (PRE-NATAL PO) Take 2 tablets by mouth daily.     No current facility-administered medications for this visit.    Known medication allergies: Allergies  Allergen Reactions   Fish Allergy Swelling   Other Other (See Comments)     Physical examination: Blood pressure 108/70, pulse 86, temperature 98.2 F (36.8 C), temperature source Temporal, resp. rate 18, height 5' 5.25" (1.657 m), weight 115 lb (52.2 kg), SpO2 99 %, unknown if currently breastfeeding.  General: Alert, interactive, in no acute distress. HEENT: PERRLA, TMs pearly gray, turbinates non-edematous without discharge, post-pharynx non  erythematous. Neck: Supple without lymphadenopathy. Lungs: Clear to auscultation without wheezing, rhonchi or rales. {no increased work of breathing. CV: Normal S1, S2 without murmurs. Abdomen: Nondistended, nontender. Skin: Warm and dry, without lesions or rashes. Extremities:  No clubbing, cyanosis or edema. Neuro:   Grossly intact.  Diagnositics/Labs:  Allergy testing:   Food Adult Perc - 09/05/21 1400     Time Antigen Placed 1406    Allergen Manufacturer Waynette Buttery    Location Back    Number of allergen test 13     Control-buffer 50% Glycerol Negative    Control-Histamine 1 mg/ml Negative    18. Catfish Negative    19. Bass Negative    20. Trout Negative    21. Tuna Negative    22. Salmon Negative    23. Flounder Negative    24. Codfish Negative    25. Shrimp Negative    26. Crab Negative    27. Lobster Negative    28. Oyster Negative             Allergy testing results were read and interpreted by provider, documented by clinical staff.   Assessment and plan: Anaphylaxis due to food  - testing today to fish and shellfish panel is non-reactive thus will obtain serum IgE levels to fish and shellfish - continue avoidance of fish and shellfish.  If serum IgE levels are negative or low then would recommend in-office food challenge to see if you are no longer allergic - have access to self-injectable epinephrine (Epipen or AuviQ) 0.3mg  at all times - follow emergency action plan in case of allergic reaction   - we have discussed the following in regards to foods:   Allergy: food allergy is when you have eaten a food, developed an allergic reaction after eating the food and have IgE to the food (positive food testing either by skin testing or blood testing).  Food allergy could lead to life threatening symptoms  Sensitivity: occurs when you have IgE to a food (positive food testing either by skin testing or blood testing) but is a food you eat without any issues.  This is  not an allergy and we recommend keeping the food in the diet  Intolerance: this is when you have negative testing by either skin testing or blood testing thus not allergic but the food causes symptoms (like belly pain, bloating, diarrhea etc) with ingestion.  These foods should be avoided to prevent symptoms.     Rhinoconjunctivitis - for springtime allergy symptoms if needing medications to manage can use the following: - for general allergy symptom control can use long-actin antihistamine like Zyrtec, Allegra or Xyzal daily as needed - for nasal congestion/headaches can use nasal steroid spray like Flonase, Rhinocort or Nasacort 2 sprays each nostril daily for 1-2 weeks at a time before stopping  once nasal congestion improves for maximum benefit - for itchy/watery eyes can use Pataday or Pazeo 1 drop each eye daily as needed - environmental allergy testing can be performed in future if wanting to know what you are reactive to in the environment  Follow-up in 1 year or sooner if needed (sooner for challenges if able) I appreciate the opportunity to take part in Zeina's care. Please do not hesitate to contact me with questions.  Sincerely,   Margo Aye, MD Allergy/Immunology [facility]

## 2021-09-05 NOTE — Patient Instructions (Addendum)
-   testing today to fish and shellfish panel is non-reactive thus will obtain serum IgE levels to fish and shellfish - continue avoidance of fish and shellfish.  If serum IgE levels are negative or low then would recommend in-office food challenge to see if you are no longer allergic - have access to self-injectable epinephrine (Epipen or AuviQ) 0.3mg  at all times - follow emergency action plan in case of allergic reaction   - we have discussed the following in regards to foods:   Allergy: food allergy is when you have eaten a food, developed an allergic reaction after eating the food and have IgE to the food (positive food testing either by skin testing or blood testing).  Food allergy could lead to life threatening symptoms  Sensitivity: occurs when you have IgE to a food (positive food testing either by skin testing or blood testing) but is a food you eat without any issues.  This is not an allergy and we recommend keeping the food in the diet  Intolerance: this is when you have negative testing by either skin testing or blood testing thus not allergic but the food causes symptoms (like belly pain, bloating, diarrhea etc) with ingestion.  These foods should be avoided to prevent symptoms.     - for springtime allergy symptoms if needing medications to manage can use the following: - for general allergy symptom control can use long-actin antihistamine like Zyrtec, Allegra or Xyzal daily as needed - for nasal congestion/headaches can use nasal steroid spray like Flonase, Rhinocort or Nasacort 2 sprays each nostril daily for 1-2 weeks at a time before stopping once nasal congestion improves for maximum benefit - for itchy/watery eyes can use Pataday or Pazeo 1 drop each eye daily as needed - environmental allergy testing can be performed in future if wanting to know what you are reactive to in the environment  Follow-up in 1 year or sooner if needed (sooner for challenges if able)

## 2023-01-26 NOTE — Progress Notes (Unsigned)
Cardiology Office Note:  .   Date:  01/27/2023  ID:  Misty [name], DOB 11-15-1990, MRN [id number] PCP: Misty Rua, MD  [facility] Providers Cardiologist:  Misty Schultz, MD {  History of Present Illness: .   Misty [name] is a 32 y.o. female with past medical history of palpitations (skipping heartbeat), aortic insufficiency, migraines, and history of shortness of breath who presents for follow-up visit.  She was seen by Dr. Anne [name] back in October 2022 and at that time she was [redacted] weeks pregnant and having more episodes of palpitations.  Happens every 15 seconds or so where she has a skipped heartbeat.  This occurs especially when she is laying down.  Back to normal when she is sitting up.  She is fine Hydrated.  Mostly sensations occur later in the day.  She is having resting tachycardia at times.  Worked up in 2019 and was unremarkable.  Did have a Holter monitor that showed occasional PVC/PAC.  Echocardiogram structurally normal.   Today, she tells me that she still has palpitations but it happens infrequently.  She has a high resting heart rate and wants to lower it by incorporating some more exercising into her routine.  She tells me that she does not plan on having anymore children at the moment.  She has a almost 34-year-old and a 39-year-old that keep her busy.  Her daughter is 49 years old and her son will be 2 in October.  She wants to make sure her heart can handle the increase in exercise.  She states that it is very difficult for her to gain weight and she basically has to continually eat to maintain her current weight.  BMI is currently on the lower end of normal at 19.4.  Reports no shortness of breath nor dyspnea on exertion. Reports no chest pain, pressure, or tightness. No edema, orthopnea, PND.    ROS: ROS located in HPI  Studies Reviewed: Marland Kitchen   EKG Interpretation Date/Time:  Monday January 27 2023 10:15:17 EDT Ventricular Rate:  71 PR Interval:  162 QRS  Duration:  76 QT Interval:  366 QTC Calculation: 397 R Axis:   88  Text Interpretation: Normal sinus rhythm Normal ECG When compared with ECG of 20-May-2021 08:56, Vent. rate has decreased BY  91 BPM ST no longer depressed in Inferior leads Nonspecific T wave abnormality no longer evident in Anterior leads Confirmed by Misty [name] [phone]) on 01/27/2023 10:52:29 AM    Study Conclusions  Echocardiogram 01/15/2018 - Left ventricle: The cavity size was normal. Wall thickness was    normal. Systolic function was normal. The estimated ejection    fraction was in the range of 55% to 60%. Left ventricular    diastolic function parameters were normal.  - Aortic valve: There was trivial regurgitation.   -------------------------------------------------------------------  Study data:  Comparison was made to the study of 07/11/2016.  Study  status:  Routine.  Procedure:  The patient reported no pain pre or  post test. Transthoracic echocardiography. Image quality was  adequate.  Study completion:  There were no complications.  Transthoracic echocardiography.  M-mode, complete 2D, spectral  Doppler, and color Doppler.  Birthdate:  Patient birthdate:  May 05, 1991.  Age:  Patient is 32 yr old.  Sex:  Gender: female.  BMI: 18.5 kg/m^2.  Blood pressure:     108/76  Patient status:  Outpatient.  Study date:  Study date: 01/15/2018. Study time: 01:59  PM.  Location:  [facility]   -------------------------------------------------------------------   -------------------------------------------------------------------  Left ventricle:  The cavity size was normal. Wall thickness was  normal. Systolic function was normal. The estimated ejection  fraction was in the range of 55% to 60%. The transmitral flow  pattern was normal. The deceleration time of the early transmitral  flow velocity was normal. The pulmonary vein flow pattern was  normal. The tissue Doppler parameters were normal. Left  ventricular  diastolic function parameters were normal.   -------------------------------------------------------------------  Aortic valve:   Structurally normal valve.   Cusp separation was  normal.  Doppler:  Transvalvular velocity was within the normal  range. There was no stenosis. There was trivial regurgitation.    -------------------------------------------------------------------  Mitral valve:   Structurally normal valve.   Leaflet separation was  normal.  Doppler:  Transvalvular velocity was within the normal  range. There was no evidence for stenosis. There was no  regurgitation.    Valve area by pressure half-time: 3.49 cm^2.  Indexed valve area by pressure half-time: 2.31 cm^2/m^2.    Peak  gradient (D): 3 mm Hg.   -------------------------------------------------------------------  Left atrium:  The atrium was normal in size.   -------------------------------------------------------------------  Right ventricle:  The cavity size was normal. Wall thickness was  normal. Systolic function was normal.   -------------------------------------------------------------------  Tricuspid valve:   Structurally normal valve.   Leaflet separation  was normal.  Doppler:  Transvalvular velocity was within the normal  range. There was trivial regurgitation.   -------------------------------------------------------------------  Right atrium:  The atrium was normal in size.   -------------------------------------------------------------------  Pericardium: There was no pericardial effusion.   -------------------------------------------------------------------  Systemic veins:  Inferior vena cava: The vessel was normal in size. The  respirophasic diameter changes were in the normal range (>= 50%),  consistent with normal central venous pressure.        Physical Exam:   VS:  BP 110/70   Pulse 71   Ht 5' 5.25" (1.657 m)   Wt 117 lb 9.6 oz (53.3 kg)   SpO2 99%   BMI 19.42 kg/m     Wt Readings from Last 3 Encounters:  01/27/23 117 lb 9.6 oz (53.3 kg)  09/05/21 115 lb (52.2 kg)  05/20/21 124 lb (56.2 kg)    GEN: Well nourished, well developed in no acute distress NECK: No JVD; No carotid bruits CARDIAC: RRR, no murmurs, rubs, gallops RESPIRATORY:  Clear to auscultation without rales, wheezing or rhonchi  ABDOMEN: Soft, non-tender, non-distended EXTREMITIES:  No edema; No deformity   ASSESSMENT AND PLAN: .   1.  Palpitations   -Update echocardiogram -Palpitations now are infrequent, not requiring any medication -Encouraged some weight training as well as cardiovascular activity -Blood pressure initially 130/72 but on repeat was 110/70. -Recommend annual TSH check with primary    Dispo: She can follow-up with me in 6 months  Signed, Sharlene Dory, PA-C

## 2023-01-27 ENCOUNTER — Ambulatory Visit: Payer: BC Managed Care – PPO | Attending: Physician Assistant | Admitting: Physician Assistant

## 2023-01-27 ENCOUNTER — Encounter: Payer: Self-pay | Admitting: Physician Assistant

## 2023-01-27 VITALS — BP 110/70 | HR 71 | Ht 65.25 in | Wt 117.6 lb

## 2023-01-27 DIAGNOSIS — R002 Palpitations: Secondary | ICD-10-CM

## 2023-01-27 NOTE — Patient Instructions (Signed)
Medication Instructions:  Your physician recommends that you continue on your current medications as directed. Please refer to the Current Medication list given to you today.  *If you need a refill on your cardiac medications before your next appointment, please call your pharmacy*  Lab Work: None ordered If you have labs (blood work) drawn today and your tests are completely normal, you will receive your results only by: MyChart Message (if you have MyChart) OR A paper copy in the mail If you have any lab test that is abnormal or we need to change your treatment, we will call you to review the results.  Testing/Procedures: Your physician has requested that you have an echocardiogram. Echocardiography is a painless test that uses sound waves to create images of your heart. It provides your doctor with information about the size and shape of your heart and how well your heart's chambers and valves are working. This procedure takes approximately one hour. There are no restrictions for this procedure. Please do NOT wear cologne, perfume, aftershave, or lotions (deodorant is allowed). Please arrive 15 minutes prior to your appointment time.    Follow-Up: At [facility], you and your health needs are our priority.  As part of our continuing mission to provide you with exceptional heart care, we have created designated Provider Care Teams.  These Care Teams include your primary Cardiologist (physician) and Advanced Practice Providers (APPs -  Physician Assistants and Nurse Practitioners) who all work together to provide you with the care you need, when you need it.  Your next appointment:   6 month(s)  Provider:   Jari Favre, PA-C        Other Instructions Increase hydration to 64 oz of water daily  Heart-Healthy Eating Plan Many factors influence your heart health, including eating and exercise habits. Heart health is also called coronary health. Coronary risk increases with  abnormal blood fat (lipid) levels. A heart-healthy eating plan includes limiting unhealthy fats, increasing healthy fats, limiting salt (sodium) intake, and making other diet and lifestyle changes. What is my plan? Your health care provider may recommend that: You limit your fat intake to _________% or less of your total calories each day. You limit your saturated fat intake to _________% or less of your total calories each day. You limit the amount of cholesterol in your diet to less than _________ mg per day. You limit the amount of sodium in your diet to less than _________ mg per day. What are tips for following this plan? Cooking Cook foods using methods other than frying. Baking, boiling, grilling, and broiling are all good options. Other ways to reduce fat include: Removing the skin from poultry. Removing all visible fats from meats. Steaming vegetables in water or broth. Meal planning  At meals, imagine dividing your plate into fourths: Fill one-half of your plate with vegetables and green salads. Fill one-fourth of your plate with whole grains. Fill one-fourth of your plate with lean protein foods. Eat 2-4 cups of vegetables per day. One cup of vegetables equals 1 cup (91 g) broccoli or cauliflower florets, 2 medium carrots, 1 large bell pepper, 1 large sweet potato, 1 large tomato, 1 medium white potato, 2 cups (150 g) raw leafy greens. Eat 1-2 cups of fruit per day. One cup of fruit equals 1 small apple, 1 large banana, 1 cup (237 g) mixed fruit, 1 large orange,  cup (82 g) dried fruit, 1 cup (240 mL) 100% fruit juice. Eat more foods that contain  soluble fiber. Examples include apples, broccoli, carrots, beans, peas, and barley. Aim to get 25-30 g of fiber per day. Increase your consumption of legumes, nuts, and seeds to 4-5 servings per week. One serving of dried beans or legumes equals  cup (90 g) cooked, 1 serving of nuts is  oz (12 almonds, 24 pistachios, or 7 walnut  halves), and 1 serving of seeds equals  oz (8 g). Fats Choose healthy fats more often. Choose monounsaturated and polyunsaturated fats, such as olive and canola oils, avocado oil, flaxseeds, walnuts, almonds, and seeds. Eat more omega-3 fats. Choose salmon, mackerel, sardines, tuna, flaxseed oil, and ground flaxseeds. Aim to eat fish at least 2 times each week. Check food labels carefully to identify foods with trans fats or high amounts of saturated fat. Limit saturated fats. These are found in animal products, such as meats, butter, and cream. Plant sources of saturated fats include palm oil, palm kernel oil, and coconut oil. Avoid foods with partially hydrogenated oils in them. These contain trans fats. Examples are stick margarine, some tub margarines, cookies, crackers, and other baked goods. Avoid fried foods. General information Eat more home-cooked food and less restaurant, buffet, and fast food. Limit or avoid alcohol. Limit foods that are high in added sugar and simple starches such as foods made using white refined flour (white breads, pastries, sweets). Lose weight if you are overweight. Losing just 5-10% of your body weight can help your overall health and prevent diseases such as diabetes and heart disease. Monitor your sodium intake, especially if you have high blood pressure. Talk with your health care provider about your sodium intake. Try to incorporate more vegetarian meals weekly. What foods should I eat? Fruits All fresh, canned (in natural juice), or frozen fruits. Vegetables Fresh or frozen vegetables (raw, steamed, roasted, or grilled). Green salads. Grains Most grains. Choose whole wheat and whole grains most of the time. Rice and pasta, including brown rice and pastas made with whole wheat. Meats and other proteins Lean, well-trimmed beef, veal, pork, and lamb. Chicken and [place] without skin. All fish and shellfish. Wild duck, rabbit, pheasant, and venison. Egg  whites or low-cholesterol egg substitutes. Dried beans, peas, lentils, and tofu. Seeds and most nuts. Dairy Low-fat or nonfat cheeses, including ricotta and mozzarella. Skim or 1% milk (liquid, powdered, or evaporated). Buttermilk made with low-fat milk. Nonfat or low-fat yogurt. Fats and oils Non-hydrogenated (trans-free) margarines. Vegetable oils, including soybean, sesame, sunflower, olive, avocado, peanut, safflower, corn, canola, and cottonseed. Salad dressings or mayonnaise made with a vegetable oil. Beverages Water (mineral or sparkling). Coffee and tea. Unsweetened ice tea. Diet beverages. Sweets and desserts Sherbet, gelatin, and fruit ice. Small amounts of dark chocolate. Limit all sweets and desserts. Seasonings and condiments All seasonings and condiments. The items listed above may not be a complete list of foods and beverages you can eat. Contact a dietitian for more options. What foods should I avoid? Fruits Canned fruit in heavy syrup. Fruit in cream or butter sauce. Fried fruit. Limit coconut. Vegetables Vegetables cooked in cheese, cream, or butter sauce. Fried vegetables. Grains Breads made with saturated or trans fats, oils, or whole milk. Croissants. Sweet rolls. Donuts. High-fat crackers, such as cheese crackers and chips. Meats and other proteins Fatty meats, such as hot dogs, ribs, sausage, bacon, rib-eye roast or steak. High-fat deli meats, such as salami and bologna. Caviar. Domestic duck and goose. Organ meats, such as liver. Dairy Cream, sour cream, cream cheese, and creamed cottage cheese.  Whole-milk cheeses. Whole or 2% milk (liquid, evaporated, or condensed). Whole buttermilk. Cream sauce or high-fat cheese sauce. Whole-milk yogurt. Fats and oils Meat fat, or shortening. Cocoa butter, hydrogenated oils, palm oil, coconut oil, palm kernel oil. Solid fats and shortenings, including bacon fat, salt pork, lard, and butter. Nondairy cream substitutes. Salad  dressings with cheese or sour cream. Beverages Regular sodas and any drinks with added sugar. Sweets and desserts Frosting. Pudding. Cookies. Cakes. Pies. Milk chocolate or white chocolate. Buttered syrups. Full-fat ice cream or ice cream drinks. The items listed above may not be a complete list of foods and beverages to avoid. Contact a dietitian for more information. Summary Heart-healthy meal planning includes limiting unhealthy fats, increasing healthy fats, limiting salt (sodium) intake and making other diet and lifestyle changes. Lose weight if you are overweight. Losing just 5-10% of your body weight can help your overall health and prevent diseases such as diabetes and heart disease. Focus on eating a balance of foods, including fruits and vegetables, low-fat or nonfat dairy, lean protein, nuts and legumes, whole grains, and heart-healthy oils and fats. This information is not intended to replace advice given to you by your health care provider. Make sure you discuss any questions you have with your health care provider. Document Revised: 08/13/2021 Document Reviewed: 08/13/2021 Elsevier Patient Education  2024 [employer].

## 2023-02-04 ENCOUNTER — Ambulatory Visit (HOSPITAL_COMMUNITY): Payer: BC Managed Care – PPO | Attending: Cardiology

## 2023-02-04 DIAGNOSIS — R002 Palpitations: Secondary | ICD-10-CM | POA: Insufficient documentation

## 2023-02-04 DIAGNOSIS — I351 Nonrheumatic aortic (valve) insufficiency: Secondary | ICD-10-CM

## 2023-02-04 LAB — ECHOCARDIOGRAM COMPLETE
Area-P 1/2: 3.6 cm2
S' Lateral: 2.6 cm

## 2023-02-19 ENCOUNTER — Other Ambulatory Visit (HOSPITAL_COMMUNITY): Payer: BC Managed Care – PPO

## 2023-05-09 ENCOUNTER — Ambulatory Visit: Payer: BLUE CROSS/BLUE SHIELD | Attending: Physician Assistant

## 2023-05-09 DIAGNOSIS — R002 Palpitations: Secondary | ICD-10-CM

## 2023-05-10 LAB — LIPID PANEL
Chol/HDL Ratio: 2.7 {ratio} (ref 0.0–4.4)
Cholesterol, Total: 186 mg/dL (ref 100–199)
HDL: 69 mg/dL (ref >39)
LDL Chol Calc (NIH): 109 mg/dL — ABNORMAL HIGH (ref 0–99)
Triglycerides: 42 mg/dL (ref 0–149)
VLDL Cholesterol Cal: 8 mg/dL (ref 5–40)

## 2024-02-27 ENCOUNTER — Telehealth: Payer: Self-pay | Admitting: Cardiology

## 2024-02-27 NOTE — Telephone Encounter (Signed)
 Pt requesting provider switch to Dr. Ladona is possible. Please advise

## 2024-03-28 NOTE — Progress Notes (Unsigned)
 Cardiology Office Note:  .   Date:  03/29/2024  ID:  Misty [name], DOB 05/28/1991, MRN [id number] PCP: Nena Rosina LITTIE DEVONNA  [facility] Providers Cardiologist:  Gordy Bergamo, MD {  History of Present Illness: .   Misty [name] is a 33 y.o. female with past medical history of palpitations (skipping heartbeat), aortic insufficiency, migraines, and history of shortness of breath who presents for follow-up visit.  She was seen by Dr. Jeffrie back in October 2022 and at that time she was [redacted] weeks pregnant and having more episodes of palpitations.  Happens every 15 seconds or so where she has a skipped heartbeat.  This occurs especially when she is laying down.  Back to normal when she is sitting up.  She is fine Hydrated.  Mostly sensations occur later in the day.  She is having resting tachycardia at times.  Worked up in 2019 and was unremarkable.  Did have a Holter monitor that showed occasional PVC/PAC.  Echocardiogram structurally normal.   Today, she tells me that she still has palpitations but it happens infrequently.  She has a high resting heart rate and wants to lower it by incorporating some more exercising into her routine.  She tells me that she does not plan on having anymore children at the moment.  She has a almost 31-year-old and a 37-year-old that keep her busy.  Her daughter is 61 years old and her son will be 2 in October.  She wants to make sure her heart can handle the increase in exercise.  She states that it is very difficult for her to gain weight and she basically has to continually eat to maintain her current weight.  BMI is currently on the lower end of normal at 19.4.  Reports no shortness of breath nor dyspnea on exertion. Reports no chest pain, pressure, or tightness. No edema, orthopnea, PND.   Today, she tells me that she continues to have palpitations that are always associated with anxiety.  She struggles with nocturnal panic attacks usually and 1 time her heart  rate got up to 180 bpm.  Usually her heart rate will get to 120 to 130 bpm and usually will last about 10 minutes.  This happens a few times a week.  She is usually able to get her heart rate down on her own with some deep breathing techniques and relaxation.  In the last 6 months she has had 2 occurrences where her heart rate has been high and its lasted longer than 20 minutes.  Sometimes she will wake up from sleep and this happens.  She will think about something during the day and her heart races occasionally but mostly it happens at night.  She has been taking oats for her nervous system to help regulate herself.  She has as needed Xanax but does not like to take this medicine since it is habit-forming.  She only uses Xanax twice a year maybe.  Reports no chest pain, pressure, or tightness. No edema, orthopnea, PND.    ROS: ROS located in HPI  Studies Reviewed: SABRA   EKG Interpretation Date/Time:  Monday March 29 2024 08:51:59 EDT Ventricular Rate:  68 PR Interval:  158 QRS Duration:  76 QT Interval:  386 QTC Calculation: 410 R Axis:   64  Text Interpretation: Normal sinus rhythm Normal ECG When compared with ECG of 27-Jan-2023 10:15, No significant change was found Confirmed by Lucien Blanc [phone]) on 03/29/2024 9:46:26 AM    Study  Conclusions  Echo 02/04/23  IMPRESSIONS     1. Left ventricular ejection fraction, by estimation, is 60 to 65%. The  left ventricle has normal function. The left ventricle has no regional  wall motion abnormalities. Left ventricular diastolic parameters were  normal.   2. Right ventricular systolic function is normal. The right ventricular  size is normal.   3. The mitral valve is normal in structure. No evidence of mitral valve  regurgitation. No evidence of mitral stenosis.   4. The aortic valve is abnormal. Aortic valve regurgitation is mild.  Aortic valve sclerosis is present, with no evidence of aortic valve  stenosis.   5. The inferior vena  cava is normal in size with greater than 50%  respiratory variability, suggesting right atrial pressure of 3 mmHg.   FINDINGS   Left Ventricle: Left ventricular ejection fraction, by estimation, is 60  to 65%. The left ventricle has normal function. The left ventricle has no  regional wall motion abnormalities. The left ventricular internal cavity  size was normal in size. There is   no left ventricular hypertrophy. Left ventricular diastolic parameters  were normal. Normal left ventricular filling pressure.   Right Ventricle: The right ventricular size is normal. No increase in  right ventricular wall thickness. Right ventricular systolic function is  normal.     Echocardiogram 01/15/2018 - Left ventricle: The cavity size was normal. Wall thickness was    normal. Systolic function was normal. The estimated ejection    fraction was in the range of 55% to 60%. Left ventricular    diastolic function parameters were normal.  - Aortic valve: There was trivial regurgitation.   -------------------------------------------------------------------  Study data:  Comparison was made to the study of 07/11/2016.  Study  status:  Routine.  Procedure:  The patient reported no pain pre or  post test. Transthoracic echocardiography. Image quality was  adequate.  Study completion:  There were no complications.  Transthoracic echocardiography.  M-mode, complete 2D, spectral  Doppler, and color Doppler.  Birthdate:  Patient birthdate:  07/18/1991.  Age:  Patient is 33 yr old.  Sex:  Gender: female.  BMI: 18.5 kg/m^2.  Blood pressure:     108/76  Patient status:  Outpatient.  Study date:  Study date: 01/15/2018. Study time: 01:59  PM.  Location:  [facility]   -------------------------------------------------------------------   -------------------------------------------------------------------  Left ventricle:  The cavity size was normal. Wall thickness was  normal. Systolic function was  normal. The estimated ejection  fraction was in the range of 55% to 60%. The transmitral flow  pattern was normal. The deceleration time of the early transmitral  flow velocity was normal. The pulmonary vein flow pattern was  normal. The tissue Doppler parameters were normal. Left ventricular  diastolic function parameters were normal.   -------------------------------------------------------------------  Aortic valve:   Structurally normal valve.   Cusp separation was  normal.  Doppler:  Transvalvular velocity was within the normal  range. There was no stenosis. There was trivial regurgitation.    -------------------------------------------------------------------  Mitral valve:   Structurally normal valve.   Leaflet separation was  normal.  Doppler:  Transvalvular velocity was within the normal  range. There was no evidence for stenosis. There was no  regurgitation.    Valve area by pressure half-time: 3.49 cm^2.  Indexed valve area by pressure half-time: 2.31 cm^2/m^2.    Peak  gradient (D): 3 mm Hg.   -------------------------------------------------------------------  Left atrium:  The atrium was normal in  size.   -------------------------------------------------------------------  Right ventricle:  The cavity size was normal. Wall thickness was  normal. Systolic function was normal.   -------------------------------------------------------------------  Tricuspid valve:   Structurally normal valve.   Leaflet separation  was normal.  Doppler:  Transvalvular velocity was within the normal  range. There was trivial regurgitation.   -------------------------------------------------------------------  Right atrium:  The atrium was normal in size.   -------------------------------------------------------------------  Pericardium: There was no pericardial effusion.   -------------------------------------------------------------------  Systemic veins:  Inferior vena cava: The  vessel was normal in size. The  respirophasic diameter changes were in the normal range (>= 50%),  consistent with normal central venous pressure.        Physical Exam:   VS:  BP 120/72 (BP Location: Right Arm, Patient Position: Sitting, Cuff Size: Normal)   Pulse 68   Ht 5' 5 (1.651 m)   Wt 118 lb 6.4 oz (53.7 kg)   SpO2 99%   BMI 19.70 kg/m    Wt Readings from Last 3 Encounters:  03/29/24 118 lb 6.4 oz (53.7 kg)  01/27/23 117 lb 9.6 oz (53.3 kg)  09/05/21 115 lb (52.2 kg)    GEN: Well nourished, well developed in no acute distress NECK: No JVD; No carotid bruits CARDIAC: RRR, no murmurs, rubs, gallops RESPIRATORY:  Clear to auscultation without rales, wheezing or rhonchi  ABDOMEN: Soft, non-tender, non-distended EXTREMITIES:  No edema; No deformity   ASSESSMENT AND PLAN: .   1.  Palpitations   -echo reviewed -happening a few times a week, will order a monitor to evaluate for SVT -Encouraged some weight training as well as cardiovascular activity -Ordered an ETT to evaluate exercise tolerance and for patient peace of mind -Recommend annual TSH, will order today with CBC and BMP   Dispo: She can follow-up with me in 6 months  Signed, Orren LOISE Fabry, PA-C

## 2024-03-29 ENCOUNTER — Encounter: Payer: Self-pay | Admitting: Physician Assistant

## 2024-03-29 ENCOUNTER — Ambulatory Visit

## 2024-03-29 ENCOUNTER — Ambulatory Visit: Payer: Self-pay | Attending: Physician Assistant | Admitting: Physician Assistant

## 2024-03-29 VITALS — BP 120/72 | HR 68 | Ht 65.0 in | Wt 118.4 lb

## 2024-03-29 DIAGNOSIS — R002 Palpitations: Secondary | ICD-10-CM

## 2024-03-29 LAB — CBC

## 2024-03-29 NOTE — Patient Instructions (Addendum)
 Medication Instructions:  Your physician recommends that you continue on your current medications as directed. Please refer to the Current Medication list given to you today.  *If you need a refill on your cardiac medications before your next appointment, please call your pharmacy*  Lab Work: TODAY: BMET, CBC, TSH If you have labs (blood work) drawn today and your tests are completely normal, you will receive your results only by: MyChart Message (if you have MyChart) OR A paper copy in the mail If you have any lab test that is abnormal or we need to change your treatment, we will call you to review the results.  Testing/Procedures: Your physician has requested that you have an exercise tolerance test. For further information please visit [email or web address] Please also follow instruction sheet, as given.   ZIO XT- Long Term Monitor Instructions  Your physician has requested you wear a ZIO patch monitor for 14 days.  This is a single patch monitor. [employer] supplies one patch monitor per enrollment. Additional stickers are not available. Please do not apply patch if you will be having a Nuclear Stress Test,  Echocardiogram, Cardiac CT, MRI, or Chest Xray during the period you would be wearing the  monitor. The patch cannot be worn during these tests. You cannot remove and re-apply the  ZIO XT patch monitor.  Your ZIO patch monitor will be mailed 3 day USPS to your address on file. It may take 3-5 days  to receive your monitor after you have been enrolled.  Once you have received your monitor, please review the enclosed instructions. Your monitor  has already been registered assigning a specific monitor serial # to you.  Billing and Patient Assistance Program Information  We have supplied [employer] with any of your insurance information on file for billing purposes. [employer] offers a sliding scale Patient Assistance Program for patients that do not have  insurance, or whose insurance does  not completely cover the cost of the ZIO monitor.  You must apply for the Patient Assistance Program to qualify for this discounted rate.  To apply, please call [employer] at [phone], select option 4, select option 2, ask to apply for  Patient Assistance Program. Meredeth will ask your household income, and how many people  are in your household. They will quote your out-of-pocket cost based on that information.  [employer] will also be able to set up a 89-month, interest-free payment plan if needed.  Applying the monitor   Shave hair from upper left chest.  Hold abrader disc by orange tab. Rub abrader in 40 strokes over the upper left chest as  indicated in your monitor instructions.  Clean area with 4 enclosed alcohol pads. Let dry.  Apply patch as indicated in monitor instructions. Patch will be placed under collarbone on left  side of chest with arrow pointing upward.  Rub patch adhesive wings for 2 minutes. Remove white label marked 1. Remove the white  label marked 2. Rub patch adhesive wings for 2 additional minutes.  While looking in a mirror, press and release button in center of patch. A small green light will  flash 3-4 times. This will be your only indicator that the monitor has been turned on.  Do not shower for the first 24 hours. You may shower after the first 24 hours.  Press the button if you feel a symptom. You will hear a small click. Record Date, Time and  Symptom in the Patient Logbook.  When you are ready to remove the  patch, follow instructions on the last 2 pages of Patient  Logbook. Stick patch monitor onto the last page of Patient Logbook.  Place Patient Logbook in the blue and white box. Use locking tab on box and tape box closed  securely. The blue and white box has prepaid postage on it. Please place it in the mailbox as  soon as possible. Your physician should have your test results approximately 7 days after the  monitor has been mailed back to [facility].   Call [facility] Customer Care at [phone] if you have questions regarding  your ZIO XT patch monitor. Call them immediately if you see an orange light blinking on your  monitor.  If your monitor falls off in less than 4 days, contact our Monitor department at [phone].  If your monitor becomes loose or falls off after 4 days call [employer] at [phone] for  suggestions on securing your monitor   Follow-Up: At [facility], you and your health needs are our priority.  As part of our continuing mission to provide you with exceptional heart care, our providers are all part of one team.  This team includes your primary Cardiologist (physician) and Advanced Practice Providers or APPs (Physician Assistants and Nurse Practitioners) who all work together to provide you with the care you need, when you need it.  Your next appointment:   6 month(s)  Provider:   ORREN FABRY, PA-C  We recommend signing up for the patient portal called MyChart.  Sign up information is provided on this After Visit Summary.  MyChart is used to connect with patients for Virtual Visits (Telemedicine).  Patients are able to view lab/test results, encounter notes, upcoming appointments, etc.  Non-urgent messages can be sent to your provider as well.   To learn more about what you can do with MyChart, go to [email or web address].

## 2024-03-29 NOTE — Progress Notes (Unsigned)
 Applied a 14 day ZIo XT monitor to patient in the office  Ganji to read

## 2024-03-30 ENCOUNTER — Encounter: Payer: Self-pay | Admitting: Physician Assistant

## 2024-03-30 LAB — BASIC METABOLIC PANEL WITH GFR
BUN/Creatinine Ratio: 12 (ref 9–23)
BUN: 8 mg/dL (ref 6–20)
CO2: 20 mmol/L (ref 20–29)
Calcium: 9.7 mg/dL (ref 8.7–10.2)
Chloride: 104 mmol/L (ref 96–106)
Creatinine, Ser: 0.65 mg/dL (ref 0.57–1.00)
Glucose: 82 mg/dL (ref 70–99)
Potassium: 4.1 mmol/L (ref 3.5–5.2)
Sodium: 138 mmol/L (ref 134–144)
eGFR: 121 mL/min/1.73 (ref >59)

## 2024-03-30 LAB — CBC
Hematocrit: 40.1 (ref 34.0–46.6)
Hemoglobin: 13.1 g/dL (ref 11.1–15.9)
MCH: 31 pg (ref 26.6–33.0)
MCHC: 32.7 g/dL (ref 31.5–35.7)
MCV: 95 fL (ref 79–97)
Platelets: 266 x10E3/uL (ref 150–450)
RBC: 4.23 x10E6/uL (ref 3.77–5.28)
RDW: 11.9 (ref 11.7–15.4)
WBC: 6.1 x10E3/uL (ref 3.4–10.8)

## 2024-03-30 LAB — TSH: TSH: 1.08 u[IU]/mL (ref 0.450–4.500)

## 2024-03-31 ENCOUNTER — Ambulatory Visit: Payer: Self-pay | Admitting: Physician Assistant

## 2024-04-22 ENCOUNTER — Encounter: Payer: Self-pay | Admitting: Physician Assistant

## 2024-04-22 ENCOUNTER — Encounter (HOSPITAL_COMMUNITY): Payer: Self-pay | Admitting: *Deleted

## 2024-04-23 ENCOUNTER — Other Ambulatory Visit (HOSPITAL_BASED_OUTPATIENT_CLINIC_OR_DEPARTMENT_OTHER): Payer: Self-pay

## 2024-04-23 ENCOUNTER — Telehealth: Payer: Self-pay | Admitting: Cardiology

## 2024-04-23 ENCOUNTER — Other Ambulatory Visit: Payer: Self-pay

## 2024-04-23 MED ORDER — METOPROLOL TARTRATE 25 MG PO TABS
12.5000 mg | ORAL_TABLET | Freq: Two times a day (BID) | ORAL | 3 refills | Status: DC | PRN
  Filled 2024-04-23: qty 90, 90d supply, fill #0

## 2024-04-23 NOTE — Telephone Encounter (Signed)
 Called patient back about message. Patient has had another episode of Tachycardia ? SVT. Patient was seeing if she could get something to help, when she has these episodes. Patient also wanted to see Dr. Ladona. Made her an appointment to see him. Will forward message to him to advise.

## 2024-04-23 NOTE — Telephone Encounter (Signed)
 Patient c/o Palpitations:  STAT if patient reporting lightheadedness, shortness of breath, or chest pain  How long have you had palpitations/irregular HR/ Afib? Are you having the symptoms now?   No  Are you currently experiencing lightheadedness, SOB or CP?   No  Do you have a history of afib (atrial fibrillation) or irregular heart rhythm?   Yes  Have you checked your BP or HR? (document readings if available):   BP  110/60  HR 135 - this morning   Are you experiencing any other symptoms?   Headache   Patient stated she had an episode of SVT this morning and the night before last.  Patient noted her heart rate was at 135 for an extended period of time this morning.

## 2024-04-27 ENCOUNTER — Encounter: Payer: Self-pay | Admitting: Cardiology

## 2024-04-27 ENCOUNTER — Ambulatory Visit: Attending: Cardiology | Admitting: Cardiology

## 2024-04-27 VITALS — BP 95/62 | HR 89 | Ht 65.0 in | Wt 118.4 lb

## 2024-04-27 DIAGNOSIS — R002 Palpitations: Secondary | ICD-10-CM | POA: Diagnosis not present

## 2024-04-27 DIAGNOSIS — F410 Panic disorder [episodic paroxysmal anxiety] without agoraphobia: Secondary | ICD-10-CM | POA: Diagnosis not present

## 2024-04-27 MED ORDER — PROPRANOLOL HCL ER 60 MG PO CP24: 60.0000 mg | ORAL_CAPSULE | Freq: Every day | ORAL | 2 refills | Status: AC

## 2024-04-27 NOTE — Patient Instructions (Signed)
 Medication Instructions:  You may take Metoprolol  as needed for fast or irregular heart rate. Continue all other medications as listed.  *If you need a refill on your cardiac medications before your next appointment, please call your pharmacy*  Follow-Up: At [facility], you and your health needs are our priority.  As part of our continuing mission to provide you with exceptional heart care, our providers are all part of one team.  This team includes your primary Cardiologist (physician) and Advanced Practice Providers or APPs (Physician Assistants and Nurse Practitioners) who all work together to provide you with the care you need, when you need it.  Your next appointment:   Follow up as needed.   We recommend signing up for the patient portal called MyChart.  Sign up information is provided on this After Visit Summary.  MyChart is used to connect with patients for Virtual Visits (Telemedicine).  Patients are able to view lab/test results, encounter notes, upcoming appointments, etc.  Non-urgent messages can be sent to your provider as well.   To learn more about what you can do with MyChart, go to [email or web address].

## 2024-04-27 NOTE — Progress Notes (Signed)
 Cardiology Office Note:  .   Date:  04/27/2024  ID:  Misty [name], DOB 1990-10-16, MRN [id number] PCP: Nena Rosina LITTIE DEVONNA  [facility] Providers Cardiologist:  Gordy Bergamo, MD   History of Present Illness: .   Misty [name] is a 33 y.o. Caucasian female patient with chronic palpitations, history of migraine without aura, was seen and evaluated by Ms. Orren Fabry, PA-C and recommended event monitor and a treadmill stress test to evaluate her symptoms of dyspnea and palpitations.  She now presents to establish care with me.  Echocardiogram on 02/04/2023 was normal  Cardiac Studies relevent.    ECHOCARDIOGRAM COMPLETE 02/04/2023  1. Left ventricular ejection fraction, by estimation, is 60 to 65%. The left ventricle has normal function. The left ventricle has no regional wall motion abnormalities. Left ventricular diastolic parameters were normal. 2. Right ventricular systolic function is normal. The right ventricular size is normal.  Event monitor 1 day on 03/29/2024 (12 hours) for palpitations (Prelim) discontinued due to equipment malfunction and new monitor placed and mailed to the company: Predominant rhythm was normal sinus rhythm.  Minimum heart rate was 59 bpm, maximum heart rate 157 bpm with average heart rate of 93 bpm. Isolated PACs were noted.  Otherwise no other significant arrhythmias. There were no patient activated events.  There was no heart block.    Discussed the use of AI scribe software for clinical note transcription with the patient, who gave verbal consent to proceed.  History of Present Illness Misty [name] is a 33 year old female who presents with episodes of tachycardia and panic attacks.  Paroxysmal tachycardia and palpitations - Episodes of tachycardia occur approximately one hour after falling asleep, awakening her with a racing heart up to 160 bpm. - Episodes last about 20 minutes, with gradual heart rate reduction when remaining calm and in  bed. - Daytime tachycardia occurs, with heart rate reaching approximately 110 bpm during activities such as driving. - Heart rate becomes very high during exercise, prompting concern. - Currently wearing a heart monitor; initial monitoring lasted a few hours, followed by four and a half days of monitoring during which episodes occurred. - Awaiting results of heart monitoring. - Prescribed metoprolol  as needed but has not taken it. - Stress test is scheduled.  Panic attacks and anxiety symptoms - History of panic attacks, with the first major episode approximately ten years ago. - Increased frequency of panic attacks over the past year. - Nearly nightly panic attacks for the past three months, though no episodes in the last two nights. - Episodes are accompanied by anxiety and shortness of breath. 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 - Mother of two young children, ages two and six.  Substance use - No tobacco use. - No alcohol consumption.   Labs   Lab Results  Component Value Date   CHOL 186 05/09/2023   HDL 69 05/09/2023   LDLCALC 109 (H) 05/09/2023   TRIG 42 05/09/2023   CHOLHDL 2.7 05/09/2023   No results found for: LIPOA  Recent Labs    03/29/24 1009  NA 138  K 4.1  CL 104  CO2 20  GLUCOSE 82  BUN 8  CREATININE 0.65  CALCIUM  9.7    Lab Results  Component Value Date   ALT 19 07/14/2021   AST 20 07/14/2021   ALKPHOS 56 07/14/2021   BILITOT 0.9 07/14/2021      Latest Ref Rng & Units 03/29/2024   10:09  AM 07/14/2021    9:31 AM 05/21/2021    5:13 AM  CBC  WBC 3.4 - 10.8 x10E3/uL 6.1  7.8  14.1   Hemoglobin 11.1 - 15.9 g/dL 86.8  87.7  88.9   Hematocrit 34.0 - 46.6 % 40.1  38.2  32.6   Platelets 150 - 450 x10E3/uL 266  270  232    No results found for: HGBA1C  Lab Results  Component Value Date   TSH 1.080 03/29/2024     ROS  Review of Systems  Cardiovascular:  Positive for dyspnea on exertion and palpitations. Negative  for chest pain and leg swelling.   Physical Exam:   VS:  BP 95/62 (BP Location: Left Arm, Patient Position: Sitting, Cuff Size: Normal)   Pulse 89   Ht 5' 5 (1.651 m)   Wt 118 lb 6.4 oz (53.7 kg)   SpO2 99%   BMI 19.70 kg/m    Wt Readings from Last 3 Encounters:  04/27/24 118 lb 6.4 oz (53.7 kg)  03/29/24 118 lb 6.4 oz (53.7 kg)  01/27/23 117 lb 9.6 oz (53.3 kg)    BP Readings from Last 3 Encounters:  04/27/24 95/62  03/29/24 120/72  01/27/23 110/70   Physical Exam Neck:     Vascular: No carotid bruit or JVD.  Cardiovascular:     Rate and Rhythm: Normal rate and regular rhythm.     Pulses: Intact distal pulses.     Heart sounds: Normal heart sounds. No murmur heard.    No gallop.  Pulmonary:     Effort: Pulmonary effort is normal.     Breath sounds: Normal breath sounds.  Abdominal:     General: Bowel sounds are normal.     Palpations: Abdomen is soft.  Musculoskeletal:     Right lower leg: No edema.     Left lower leg: No edema.    EKG:       EKG 03/29/2024: Normal sinus rhythm with rate of 68 bpm, normal EKG.  Personally reviewed.  ASSESSMENT AND PLAN: .      ICD-10-CM   1. Palpitations  R00.2 propranolol  ER (INDERAL  LA) 60 MG 24 hr capsule    2. Panic attacks  F41.0 propranolol  ER (INDERAL  LA) 60 MG 24 hr capsule     Assessment & Plan Panic disorder with palpitations and tachycardia Episodes of panic attacks primarily at night, approximately one hour after falling asleep, characterized by palpitations and tachycardia with heart rates reaching 160 bpm. Symptoms include shortness of breath and anxiety. Gradual resolution of symptoms suggests panic disorder rather than supraventricular tachycardia (SVT). Differential diagnosis includes SVT, but gradual onset and resolution make this less likely. Previous short-term monitoring was inconclusive, and results from a recent 4.5-day monitor are pending. - Prescribe propranolol  long-acting 60 mg capsule once daily to  manage symptoms and reduce anxiety. - Advise to keep well hydrated and make homemade electrolyte solution with lemon, salt, and sugar. - Instruct not to take propranolol  one day before and on the day of the scheduled stress test, but to resume immediately after the test. - Review results of the 4.5-day heart monitor when available. - Evaluate results of the upcoming stress test and follow up as needed.  Anxiety disorder Chronic anxiety with episodes of panic attacks occurring both during the day and at night. Symptoms have been present for approximately three months, with increased frequency over the past year. Stressors include work and family responsibilities. Propranolol  is chosen to manage anxiety due to  its calming effects and ability to prevent panic attacks. - Prescribe propranolol  long-acting 60 mg capsule once daily to manage anxiety and prevent panic attacks. - Discuss potential side effects of propranolol , including dizziness and rare weight gain, and advise on hydration. - Advise to monitor response to propranolol  and adjust timing of dose based on side effects such as fatigue or sleepiness.   Follow up: As needed unless her event monitor that she has made recently shows significant arrhythmias or SVT.  Signed,  Gordy Bergamo, MD, [facility] 04/27/2024, 7:04 PM [facility] [address] [place], [place] [address] Phone: [phone]. Fax:  [phone]

## 2024-05-06 ENCOUNTER — Inpatient Hospital Stay (HOSPITAL_COMMUNITY): Admission: RE | Admit: 2024-05-06 | Source: Ambulatory Visit

## 2024-05-13 ENCOUNTER — Telehealth: Payer: Self-pay | Admitting: Cardiology

## 2024-05-13 ENCOUNTER — Ambulatory Visit (HOSPITAL_COMMUNITY)
Admission: RE | Admit: 2024-05-13 | Source: Ambulatory Visit | Attending: Physician Assistant | Admitting: Physician Assistant

## 2024-05-13 NOTE — Telephone Encounter (Signed)
 Patient stated she has a stress test today and had a panic attack and had to take a Xanax.  Patient wants to know if she should still do the testing.

## 2024-05-13 NOTE — Telephone Encounter (Signed)
 Matter addressed via phone with another nurse.

## 2024-05-21 ENCOUNTER — Ambulatory Visit (HOSPITAL_COMMUNITY)
Admission: RE | Admit: 2024-05-21 | Discharge: 2024-05-21 | Disposition: A | Discharge status: Home / Self Care | Source: Ambulatory Visit | Attending: Physician Assistant | Admitting: Physician Assistant

## 2024-05-21 DIAGNOSIS — R002 Palpitations: Secondary | ICD-10-CM | POA: Diagnosis present

## 2024-05-21 LAB — EXERCISE TOLERANCE TEST
Angina Index: 0
Duke Treadmill Score: 10
Estimated workload: 10.9
Exercise duration (min): 9 min
Exercise duration (sec): 30 s
MPHR: 189 {beats}/min
Peak HR: 179 {beats}/min
Percent HR: 94 %
RPE: 18
Rest HR: 114 {beats}/min
ST Depression (mm): 0 mm

## 2024-07-09 ENCOUNTER — Other Ambulatory Visit: Payer: Self-pay

## 2024-07-09 ENCOUNTER — Ambulatory Visit
Admission: EM | Admit: 2024-07-09 | Discharge: 2024-07-09 | Disposition: A | Discharge status: Home / Self Care | Attending: Family Medicine | Admitting: Family Medicine

## 2024-07-09 DIAGNOSIS — R509 Fever, unspecified: Secondary | ICD-10-CM | POA: Diagnosis not present

## 2024-07-09 DIAGNOSIS — J101 Influenza due to other identified influenza virus with other respiratory manifestations: Secondary | ICD-10-CM

## 2024-07-09 LAB — POCT INFLUENZA A/B
Influenza A, POC: POSITIVE — AB
Influenza B, POC: NEGATIVE

## 2024-07-09 LAB — POC SOFIA SARS ANTIGEN FIA: SARS Coronavirus 2 Ag: NEGATIVE

## 2024-07-09 MED ORDER — OSELTAMIVIR PHOSPHATE 75 MG PO CAPS: 75.0000 mg | ORAL_CAPSULE | Freq: Two times a day (BID) | ORAL | 0 refills | Status: AC

## 2024-07-09 NOTE — ED Triage Notes (Signed)
 Pt presenting with fever and body ache x 1 day. Pt denies any other symptoms.  Pt stated that she had a dental graft  done on 07/01/2024 and was concern if symptoms may be related to the procedure. Tylenol  was taken last night which was effective.

## 2024-07-09 NOTE — ED Provider Notes (Incomplete)
 " [name]    CSN: [id number] Arrival date & time: 07/09/24  1929      History   Chief Complaint Chief Complaint  Patient presents with   Fever    HPI Misty [name] is a 33 y.o. female.   HPI  Past Medical History:  Diagnosis Date   Aortic insufficiency    Echo 5/14: EF 60-65, trace MR/AI // Echo 12/17 (in [place]):  EF 60-65, no RWMA, normal diastolic function, trace/mild AI, trace TR // Echo 6/19: EF 55-60, normal diastolic function, trivial AI    History of exercise stress test    GXT 5/14:  No ischemic changes   Migraines    Palpitations    Holter 1/18:  NSR, HR 51-152   Preterm labor    SOB (shortness of breath)     Patient Active Problem List   Diagnosis Date Noted   Second degree perineal laceration during delivery 05/21/2021   SVD (spontaneous vaginal delivery) 05/21/2021   Postpartum care following vaginal delivery 10/30 05/21/2021   Normal labor 05/20/2021   Preterm labor in third trimester with preterm delivery 12/03/2017   Preterm labor with preterm delivery 12/02/2017   Preterm labor in third trimester 11/24/2017   Palpitations 07/29/2013    Past Surgical History:  Procedure Laterality Date   NO PAST SURGERIES      OB History     Gravida  2   Para  2   Term  1   Preterm  1   AB      Living  2      SAB      IAB      Ectopic      Multiple  0   Live Births  2            Home Medications    Prior to Admission medications  Medication Sig Start Date End Date Taking? Authorizing Provider  oseltamivir (TAMIFLU) 75 MG capsule Take 1 capsule (75 mg total) by mouth every 12 (twelve) hours. 07/09/24  Yes Pauline Garnette LABOR, MD  ALPRAZolam (XANAX) 0.5 MG tablet Take 0.5 mg by mouth as needed for anxiety. 07/24/23   [provider]  propranolol  ER (INDERAL  LA) 60 MG 24 hr capsule Take 1 capsule (60 mg total) by mouth daily. 04/27/24   Ladona Heinz, MD    Family History Family History  Problem Relation Age of  Onset   Thyroid  disease Mother    Hypertension Father    Eczema Sister    Allergic rhinitis Sister    Allergic rhinitis Maternal Grandmother    Heart disease Maternal Grandmother    Hypertension Maternal Grandfather    CVA Paternal Grandfather    Asthma Neg Hx    Atopy Neg Hx    Immunodeficiency Neg Hx    Angioedema Neg Hx    Urticaria Neg Hx     Social History Social History[1]   Allergies   Fish allergy and Other   Review of Systems Review of Systems   Physical Exam Triage Vital Signs ED Triage Vitals  Encounter Vitals Group     BP 07/09/24 1950 122/88     Girls Systolic BP Percentile --      Girls Diastolic BP Percentile --      Boys Systolic BP Percentile --      Boys Diastolic BP Percentile --      Pulse Rate 07/09/24 1950 (!) 111     Resp 07/09/24 1950 20  Temp 07/09/24 1950 98.4 F (36.9 C)     Temp Source 07/09/24 1950 Oral     SpO2 07/09/24 1950 97 %     Weight 07/09/24 1951 115 lb (52.2 kg)     Height 07/09/24 1951 5' 5 (1.651 m)     Head Circumference --      Peak Flow --      Pain Score 07/09/24 1951 0     Pain Loc --      Pain Education --      Exclude from Growth Chart --    No data found.  Updated Vital Signs BP 122/88 (BP Location: Right Arm)   Pulse (!) 111   Temp 98.4 F (36.9 C) (Oral)   Resp 20   Ht 5' 5 (1.651 m)   Wt 52.2 kg   LMP 06/16/2024 (Exact Date)   SpO2 97%   BMI 19.14 kg/m   Visual Acuity Right Eye Distance:   Left Eye Distance:   Bilateral Distance:    Right Eye Near:   Left Eye Near:    Bilateral Near:     Physical Exam   UC Treatments / Results  Labs (all labs ordered are listed, but only abnormal results are displayed) Labs Reviewed  POCT INFLUENZA A/B - Abnormal; Notable for the following components:      Result Value   Influenza A, POC Positive (*)    All other components within normal limits  POC SOFIA SARS ANTIGEN FIA - Normal    EKG   Radiology No results  found.  Procedures Procedures (including critical care time)  Medications Ordered in UC Medications - No data to display  Initial Impression / Assessment and Plan / UC Course  I have reviewed the triage vital signs and the nursing notes.  Pertinent labs & imaging results that were available during my care of the patient were reviewed by me and considered in my medical decision making (see chart for details).    Begin Tamiflu. Followup with Family Doctor if not improved in about 5 days.  Final Clinical Impressions(s) / UC Diagnoses   Final diagnoses:  Febrile illness  Influenza A     Discharge Instructions      Rest.  Increase fluid intake. May take Tylenol  or ibuprofen  as needed for fever, body aches, etc.  If symptoms become significantly worse during the night or over the weekend, proceed to the local emergency room.     ED Prescriptions     Medication Sig Dispense Auth. Provider   oseltamivir (TAMIFLU) 75 MG capsule Take 1 capsule (75 mg total) by mouth every 12 (twelve) hours. 10 capsule Pauline Garnette LABOR, MD      PDMP not reviewed this encounter.    [1]  Social History Tobacco Use   Smoking status: Never    Passive exposure: Current (husband vapes)   Smokeless tobacco: Never  Vaping Use   Vaping status: Never Used  Substance Use Topics   Alcohol use: Not Currently    Comment: occasional   Drug use: No   "

## 2024-07-09 NOTE — Discharge Instructions (Addendum)
 Rest.  Increase fluid intake. May take Tylenol  or ibuprofen  as needed for fever, body aches, etc.  If symptoms become significantly worse during the night or over the weekend, proceed to the local emergency room.

## 2024-07-10 ENCOUNTER — Telehealth: Payer: Self-pay

## 2024-08-01 ENCOUNTER — Encounter: Payer: Self-pay | Admitting: Cardiology
